# Patient Record
Sex: Female | Born: 1960 | Race: White | Hispanic: No | Smoking: Never smoker
Health system: Southern US, Community
[De-identification: ages and names within clinical notes are randomized; demographics above are authoritative.]

## PROBLEM LIST (undated history)

## (undated) DIAGNOSIS — I2510 Atherosclerotic heart disease of native coronary artery without angina pectoris: Secondary | ICD-10-CM

## (undated) DIAGNOSIS — C801 Malignant (primary) neoplasm, unspecified: Secondary | ICD-10-CM

## (undated) DIAGNOSIS — E039 Hypothyroidism, unspecified: Secondary | ICD-10-CM

## (undated) DIAGNOSIS — I10 Essential (primary) hypertension: Secondary | ICD-10-CM

## (undated) DIAGNOSIS — E669 Obesity, unspecified: Secondary | ICD-10-CM

## (undated) DIAGNOSIS — Z923 Personal history of irradiation: Secondary | ICD-10-CM

## (undated) HISTORY — PX: OTHER SURGICAL HISTORY: SHX169

## (undated) HISTORY — PX: TONSILLECTOMY: SUR1361

## (undated) HISTORY — PX: GASTRIC BYPASS: SHX52

## (undated) HISTORY — PX: CHOLECYSTECTOMY: SHX55

---

## 2013-08-01 DIAGNOSIS — Z923 Personal history of irradiation: Secondary | ICD-10-CM

## 2013-08-01 HISTORY — DX: Personal history of irradiation: Z92.3

## 2015-04-30 ENCOUNTER — Encounter (HOSPITAL_COMMUNITY): Payer: Self-pay

## 2015-04-30 ENCOUNTER — Emergency Department (HOSPITAL_COMMUNITY)
Admission: EM | Admit: 2015-04-30 | Discharge: 2015-05-01 | Disposition: A | Discharge status: Home / Self Care | Payer: Medicare Other | Attending: Emergency Medicine | Admitting: Emergency Medicine

## 2015-04-30 DIAGNOSIS — I10 Essential (primary) hypertension: Secondary | ICD-10-CM | POA: Diagnosis not present

## 2015-04-30 DIAGNOSIS — N760 Acute vaginitis: Secondary | ICD-10-CM | POA: Diagnosis not present

## 2015-04-30 DIAGNOSIS — I2510 Atherosclerotic heart disease of native coronary artery without angina pectoris: Secondary | ICD-10-CM | POA: Insufficient documentation

## 2015-04-30 DIAGNOSIS — N898 Other specified noninflammatory disorders of vagina: Secondary | ICD-10-CM | POA: Diagnosis present

## 2015-04-30 DIAGNOSIS — N390 Urinary tract infection, site not specified: Secondary | ICD-10-CM | POA: Diagnosis not present

## 2015-04-30 DIAGNOSIS — E669 Obesity, unspecified: Secondary | ICD-10-CM | POA: Insufficient documentation

## 2015-04-30 DIAGNOSIS — A599 Trichomoniasis, unspecified: Secondary | ICD-10-CM | POA: Insufficient documentation

## 2015-04-30 DIAGNOSIS — Z9884 Bariatric surgery status: Secondary | ICD-10-CM | POA: Diagnosis not present

## 2015-04-30 DIAGNOSIS — Z79899 Other long term (current) drug therapy: Secondary | ICD-10-CM | POA: Diagnosis not present

## 2015-04-30 DIAGNOSIS — E039 Hypothyroidism, unspecified: Secondary | ICD-10-CM | POA: Diagnosis not present

## 2015-04-30 DIAGNOSIS — B9689 Other specified bacterial agents as the cause of diseases classified elsewhere: Secondary | ICD-10-CM

## 2015-04-30 DIAGNOSIS — Z85841 Personal history of malignant neoplasm of brain: Secondary | ICD-10-CM | POA: Insufficient documentation

## 2015-04-30 HISTORY — DX: Hypothyroidism, unspecified: E03.9

## 2015-04-30 HISTORY — DX: Essential (primary) hypertension: I10

## 2015-04-30 HISTORY — DX: Malignant (primary) neoplasm, unspecified: C80.1

## 2015-04-30 HISTORY — DX: Atherosclerotic heart disease of native coronary artery without angina pectoris: I25.10

## 2015-04-30 HISTORY — DX: Obesity, unspecified: E66.9

## 2015-04-30 LAB — URINALYSIS, ROUTINE W REFLEX MICROSCOPIC
Bilirubin Urine: NEGATIVE
Glucose, UA: NEGATIVE mg/dL
Hgb urine dipstick: NEGATIVE
Ketones, ur: NEGATIVE mg/dL
Nitrite: NEGATIVE
Protein, ur: NEGATIVE mg/dL
Specific Gravity, Urine: 1.02 (ref 1.005–1.030)
Urobilinogen, UA: 1 mg/dL (ref 0.0–1.0)
pH: 7.5 (ref 5.0–8.0)

## 2015-04-30 LAB — WET PREP, GENITAL: Yeast Wet Prep HPF POC: NONE SEEN

## 2015-04-30 LAB — URINE MICROSCOPIC-ADD ON

## 2015-04-30 MED ORDER — AZITHROMYCIN 250 MG PO TABS
1000.0000 mg | ORAL_TABLET | Freq: Once | ORAL | Status: AC
  Administered 2015-04-30: 1000 mg via ORAL
  Filled 2015-04-30: qty 4

## 2015-04-30 MED ORDER — LIDOCAINE HCL 1 % IJ SOLN
INTRAMUSCULAR | Status: AC
  Filled 2015-04-30: qty 20

## 2015-04-30 MED ORDER — CEFTRIAXONE SODIUM 250 MG IJ SOLR
250.0000 mg | INTRAMUSCULAR | Status: DC
  Administered 2015-04-30: 250 mg via INTRAMUSCULAR
  Filled 2015-04-30: qty 250

## 2015-04-30 NOTE — ED Notes (Signed)
Pelvic setup at bedside.

## 2015-04-30 NOTE — ED Provider Notes (Signed)
CSN: [id number]     Arrival date & time 04/30/15  1735 History   First MD Initiated Contact with Patient 04/30/15 2045     Chief Complaint  Patient presents with  . Vaginal Itching   HPI   54 year old female presents today with vaginal itching. Patient reports that she lives in an assisted living facility, recently transferred to [facility]. She reports she has been unable to have her primary care switched over on her Medicaid card. She notes that she has episodes of vaginal itching multiple times year, is prescribed Premarin by her physician that relieves the symptoms. She notes symptoms have been present for 3 weeks, she denies rash, reports that after excessive itchiness and scratching she does have some excoriation and bleeding. Patient does note some burning with urination. Denies frequency, change in color clarity or characteristics. Patient reports she is not diabetic, is otherwise healthy and has no other complaints. She reports she does have itching up under her belly folds in addition. Denies fever, chills, nausea, vomiting, abdominal pain, chest pain, shortness of breath, or any other rashes. Patient reports last sexual activity was in March 2016.  Past Medical History  Diagnosis Date  . Hypothyroidism   . Obesity   . Hypertension   . Coronary artery disease   . Cancer     Brain   Past Surgical History  Procedure Laterality Date  . Tonsillectomy    . Cholecystectomy    . Gastric bypass    . Arm surgery     No family history on file. History  Substance Use Topics  . Smoking status: Never Smoker   . Smokeless tobacco: Not on file  . Alcohol Use: No   OB History    No data available     Review of Systems  All other systems reviewed and are negative.   Allergies  Avapro; Clindamycin/lincomycin; Codeine; Ibuprofen; Prilosec; Vasotec; Vistaril; Zyrtec; and Keflex  Home Medications   Prior to Admission medications   Medication Sig Start Date End Date Taking?  Authorizing Provider  bisacodyl (DULCOLAX) 10 MG suppository Place 10 mg rectally as needed for mild constipation or moderate constipation (*self administered*).   Yes Historical Provider, MD  citalopram (CELEXA) 40 MG tablet Take 40 mg by mouth daily.   Yes Historical Provider, MD  guaifenesin (ROBITUSSIN) 100 MG/5ML syrup Take 200 mg by mouth 4 (four) times daily as needed for cough.   Yes Historical Provider, MD  levothyroxine (SYNTHROID, LEVOTHROID) 50 MCG tablet Take 50 mcg by mouth daily before breakfast.   Yes Historical Provider, MD  lisinopril (PRINIVIL,ZESTRIL) 5 MG tablet Take 5 mg by mouth daily.   Yes Historical Provider, MD  magnesium hydroxide (MILK OF MAGNESIA) 400 MG/5ML suspension Take 15 mLs by mouth daily as needed for mild constipation.   Yes Historical Provider, MD  meclizine (ANTIVERT) 25 MG tablet Take 25 mg by mouth daily.   Yes Historical Provider, MD  metoprolol (LOPRESSOR) 50 MG tablet Take 50 mg by mouth 2 (two) times daily.   Yes Historical Provider, MD  metroNIDAZOLE (FLAGYL) 500 MG tablet Take 1 tablet (500 mg total) by mouth 2 (two) times daily. 05/01/15   Okey Regal, PA-C  Multiple Vitamin (MULTIVITAMIN WITH MINERALS) TABS tablet Take 1 tablet by mouth daily.   Yes Historical Provider, MD  nystatin (MYCOSTATIN/NYSTOP) 100000 UNIT/GM POWD Apply 1-2 g topically daily. For 3 weeks then leave at bedside as needed.   Yes Historical Provider, MD  Olopatadine HCl (PATADAY) 0.2 %  SOLN Apply 1 drop to eye daily.   Yes Historical Provider, MD  potassium chloride SA (K-DUR,KLOR-CON) 20 MEQ tablet Take 20 mEq by mouth daily.   Yes Historical Provider, MD  ranitidine (ZANTAC) 150 MG tablet Take 150 mg by mouth daily.   Yes Historical Provider, MD  solifenacin (VESICARE) 10 MG tablet Take 10 mg by mouth daily.   Yes Historical Provider, MD  sulfamethoxazole-trimethoprim (BACTRIM DS,SEPTRA DS) 800-160 MG per tablet Take 1 tablet by mouth 2 (two) times daily. 05/01/15 05/08/15   Okey Regal, PA-C  traMADol (ULTRAM) 50 MG tablet Take 25 mg by mouth every 12 (twelve) hours as needed for moderate pain.   Yes Historical Provider, MD  triamterene-hydrochlorothiazide (MAXZIDE-25) 37.5-25 MG per tablet Take 1 tablet by mouth daily.   Yes Historical Provider, MD  verapamil (VERELAN PM) 240 MG 24 hr capsule Take 240 mg by mouth daily.   Yes Historical Provider, MD   BP 110/70 mmHg  Pulse 78  Temp(Src) 97.9 F (36.6 C) (Oral)  Resp 20  SpO2 100%   Physical Exam  Constitutional: She is oriented to person, place, and time. She appears well-developed and well-nourished.  HENT:  Head: Normocephalic and atraumatic.  Eyes: Conjunctivae are normal. Pupils are equal, round, and reactive to light. Right eye exhibits no discharge. Left eye exhibits no discharge. No scleral icterus.  Neck: Normal range of motion. No JVD present. No tracheal deviation present.  Cardiovascular: Normal rate.   Pulmonary/Chest: Effort normal. No stridor.  Genitourinary: No erythema, tenderness or bleeding in the vagina. No foreign body around the vagina. No signs of injury around the vagina. Vaginal discharge found.  Intertrigo to bell and groin, minimal.   Neurological: She is alert and oriented to person, place, and time. Coordination normal.  Psychiatric: She has a normal mood and affect. Her behavior is normal. Judgment and thought content normal.  Nursing note and vitals reviewed.    ED Course  Procedures (including critical care time) Labs Review Labs Reviewed  WET PREP, GENITAL - Abnormal; Notable for the following:    Trich, Wet Prep MODERATE (*)    Clue Cells Wet Prep HPF POC FEW (*)    WBC, Wet Prep HPF POC MANY (*)    All other components within normal limits  URINALYSIS, ROUTINE W REFLEX MICROSCOPIC (NOT AT [facility]) - Abnormal; Notable for the following:    Leukocytes, UA LARGE (*)    All other components within normal limits  URINE MICROSCOPIC-ADD ON - Abnormal; Notable for the  following:    Bacteria, UA FEW (*)    All other components within normal limits  RPR  HIV ANTIBODY (ROUTINE TESTING)    Imaging Review No results found.   EKG Interpretation None      MDM   Final diagnoses:  Vaginal itching  UTI (lower urinary tract infection)  BV (bacterial vaginosis)  Trichomoniasis    Labs: Urinalysis, wet prep, HIV, RPR- significant for trichomoniasis, clue cells  Imaging:  Consults:  Therapeutics: Rocephin, azithromycin  Discharge Meds: Metronidazole, Bactrim  Assessment/Plan: Patient presents with vaginal itching and burning with urinations and rash to the groin. Patient has been using nystatin powder which is significantly improved the rash, discharge likely from trichomoniasis BV, prophylactically treated for gonorrhea Chlamydia. Patient discharged home with medications as above for urinary tract infection and STDs. She is encouraged follow-up to primary care provider for reevaluation, strict return precautions given, verbalized understanding agreement and had no further questions or concerns at time discharge.  Okey Regal, PA-C 05/01/15 0100  Malvin Johns, MD 05/01/15 [phone]

## 2015-04-30 NOTE — ED Notes (Signed)
Per EMS, Pt, from [facility], c/o vaginal itching x 3 weeks.  Denies pain and discharge.

## 2015-05-01 LAB — RPR: RPR Ser Ql: NONREACTIVE

## 2015-05-01 LAB — HIV ANTIBODY (ROUTINE TESTING W REFLEX): HIV Screen 4th Generation wRfx: NONREACTIVE

## 2015-05-01 MED ORDER — METRONIDAZOLE 500 MG PO TABS: 500.0000 mg | ORAL_TABLET | Freq: Two times a day (BID) | ORAL | Status: DC

## 2015-05-01 MED ORDER — SULFAMETHOXAZOLE-TRIMETHOPRIM 800-160 MG PO TABS: 1.0000 | ORAL_TABLET | Freq: Two times a day (BID) | ORAL | Status: DC

## 2015-05-01 NOTE — Discharge Instructions (Signed)
Please use medication as directed, please follow-up with her primary care provider for further evaluation and management. Please return to the ED if new or worsening signs or symptoms present.

## 2015-05-01 NOTE — ED Notes (Signed)
[facility] contacted for transport back to [facility]/  Pt c/o feeling nausea.  States this is not new.  She gets nauseous all the time.

## 2015-05-03 ENCOUNTER — Emergency Department (HOSPITAL_COMMUNITY)
Admission: EM | Admit: 2015-05-03 | Discharge: 2015-05-03 | Disposition: A | Discharge status: Home / Self Care | Payer: Medicare Other | Attending: Emergency Medicine | Admitting: Emergency Medicine

## 2015-05-03 ENCOUNTER — Encounter (HOSPITAL_COMMUNITY): Payer: Self-pay | Admitting: Emergency Medicine

## 2015-05-03 DIAGNOSIS — R21 Rash and other nonspecific skin eruption: Secondary | ICD-10-CM | POA: Diagnosis present

## 2015-05-03 DIAGNOSIS — L509 Urticaria, unspecified: Secondary | ICD-10-CM | POA: Diagnosis not present

## 2015-05-03 DIAGNOSIS — E039 Hypothyroidism, unspecified: Secondary | ICD-10-CM | POA: Diagnosis not present

## 2015-05-03 DIAGNOSIS — I2510 Atherosclerotic heart disease of native coronary artery without angina pectoris: Secondary | ICD-10-CM | POA: Diagnosis not present

## 2015-05-03 DIAGNOSIS — E669 Obesity, unspecified: Secondary | ICD-10-CM | POA: Diagnosis not present

## 2015-05-03 DIAGNOSIS — Z79899 Other long term (current) drug therapy: Secondary | ICD-10-CM | POA: Insufficient documentation

## 2015-05-03 DIAGNOSIS — I10 Essential (primary) hypertension: Secondary | ICD-10-CM | POA: Insufficient documentation

## 2015-05-03 DIAGNOSIS — Z85841 Personal history of malignant neoplasm of brain: Secondary | ICD-10-CM | POA: Insufficient documentation

## 2015-05-03 MED ORDER — NITROFURANTOIN MONOHYD MACRO 100 MG PO CAPS: 100.0000 mg | ORAL_CAPSULE | Freq: Two times a day (BID) | ORAL | Status: DC

## 2015-05-03 MED ORDER — HYDROCORTISONE 1 % EX CREA: TOPICAL_CREAM | CUTANEOUS | Status: DC

## 2015-05-03 NOTE — ED Notes (Signed)
EDP at bedside  

## 2015-05-03 NOTE — ED Provider Notes (Signed)
CSN: [id number]   Arrival date & time 05/03/15 0031  History  This chart was scribed for  Gabriella Freiberg, MD by [name], ED Scribe. This patient was seen in room D32C/D32C and the patient's care was started at 12:34 AM.  Chief Complaint  Patient presents with  . Rash    HPI The history is provided by the patient. No language interpreter was used.   Gabriella Roth is a 54 y.o. female who presents to the Emergency Department complaining of pruritic rash to with onset this evening after starting Flagyl and sulfamethoxazole-trimethoprim this morning. The rash is on the neck and arms and she notes that it is mildly painful and significantly pruritic. Pt was seen at [facility] 3 days ago where she was treated with Rocephin IM and azithromycin for a UTI and trichomoniasis. She took nothing for the rash and itching because she has had jitteriness with Benadryl in the past. Pt denies SOB. She lives at [facility] because of fall risks associated with a brain tumor. No one else at the facility has a similar rash that she is aware of. She is unsure if the facility started using a new detergent. Last saw Dr. Vallarie Mare for the brain cancer earlier this year. Treated with radiation therapy in 2014.   Past Medical History  Diagnosis Date  . Hypothyroidism   . Obesity   . Hypertension   . Coronary artery disease   . Cancer     Brain    Past Surgical History  Procedure Laterality Date  . Tonsillectomy    . Cholecystectomy    . Gastric bypass    . Arm surgery      No family history on file.  History  Substance Use Topics  . Smoking status: Never Smoker   . Smokeless tobacco: Not on file  . Alcohol Use: No     Review of Systems  Constitutional: Negative for fever and chills.  HENT: Negative for sore throat and trouble swallowing.   Respiratory: Negative for chest tightness and shortness of breath.   Skin: Positive for rash.  All other systems reviewed and are negative.    Home  Medications   Prior to Admission medications   Medication Sig Start Date End Date Taking? Authorizing Provider  bisacodyl (DULCOLAX) 10 MG suppository Place 10 mg rectally as needed for mild constipation or moderate constipation (*self administered*).    Historical Provider, MD  citalopram (CELEXA) 40 MG tablet Take 40 mg by mouth daily.    Historical Provider, MD  guaifenesin (ROBITUSSIN) 100 MG/5ML syrup Take 200 mg by mouth 4 (four) times daily as needed for cough.    Historical Provider, MD  hydrocortisone cream 1 % Apply to affected area 3 times daily 05/03/15   Gabriella Freiberg, MD  levothyroxine (SYNTHROID, LEVOTHROID) 50 MCG tablet Take 50 mcg by mouth daily before breakfast.    Historical Provider, MD  lisinopril (PRINIVIL,ZESTRIL) 5 MG tablet Take 5 mg by mouth daily.    Historical Provider, MD  magnesium hydroxide (MILK OF MAGNESIA) 400 MG/5ML suspension Take 15 mLs by mouth daily as needed for mild constipation.    Historical Provider, MD  meclizine (ANTIVERT) 25 MG tablet Take 25 mg by mouth daily.    Historical Provider, MD  metoprolol (LOPRESSOR) 50 MG tablet Take 50 mg by mouth 2 (two) times daily.    Historical Provider, MD  metroNIDAZOLE (FLAGYL) 500 MG tablet Take 1 tablet (500 mg total) by mouth 2 (two) times daily. 05/01/15  Okey Regal, PA-C  Multiple Vitamin (MULTIVITAMIN WITH MINERALS) TABS tablet Take 1 tablet by mouth daily.    Historical Provider, MD  nitrofurantoin, macrocrystal-monohydrate, (MACROBID) 100 MG capsule Take 1 capsule (100 mg total) by mouth 2 (two) times daily. X 7 days 05/03/15   Gabriella Freiberg, MD  nystatin (MYCOSTATIN/NYSTOP) 100000 UNIT/GM POWD Apply 1-2 g topically daily. For 3 weeks then leave at bedside as needed.    Historical Provider, MD  Olopatadine HCl (PATADAY) 0.2 % SOLN Apply 1 drop to eye daily.    Historical Provider, MD  potassium chloride SA (K-DUR,KLOR-CON) 20 MEQ tablet Take 20 mEq by mouth daily.    Historical Provider, MD  ranitidine  (ZANTAC) 150 MG tablet Take 150 mg by mouth daily.    Historical Provider, MD  solifenacin (VESICARE) 10 MG tablet Take 10 mg by mouth daily.    Historical Provider, MD  traMADol (ULTRAM) 50 MG tablet Take 25 mg by mouth every 12 (twelve) hours as needed for moderate pain.    Historical Provider, MD  triamterene-hydrochlorothiazide (MAXZIDE-25) 37.5-25 MG per tablet Take 1 tablet by mouth daily.    Historical Provider, MD  verapamil (VERELAN PM) 240 MG 24 hr capsule Take 240 mg by mouth daily.    Historical Provider, MD    Allergies  Avapro; Codeine; Ibuprofen; Prilosec; Vasotec; Vistaril; Zyrtec; Clindamycin/lincomycin; and Keflex  Triage Vitals: BP 117/62 mmHg  Pulse 73  Temp(Src) 97.8 F (36.6 C) (Oral)  Resp 16  SpO2 98%  Physical Exam  Constitutional: She is oriented to person, place, and time. She appears well-developed and well-nourished.  HENT:  Head: Normocephalic and atraumatic.  Right Ear: External ear normal.  Left Ear: External ear normal.  Eyes: Conjunctivae and EOM are normal. Pupils are equal, round, and reactive to light.  Neck: Normal range of motion. Neck supple.  Cardiovascular: Normal rate, regular rhythm, normal heart sounds and intact distal pulses.   Pulmonary/Chest: Effort normal and breath sounds normal.  Abdominal: Soft. Bowel sounds are normal. There is no tenderness.  Musculoskeletal: Normal range of motion.  Neurological: She is alert and oriented to person, place, and time.  Skin: Skin is warm and dry.  Papular/urticarial rash over neck, bil shoulders, L shin  Vitals reviewed.   ED Course  Procedures   DIAGNOSTIC STUDIES: Oxygen Saturation is 98% on RA, normal by my interpretation.    COORDINATION OF CARE: 12:44 AM Discussed treatment plan which includes discharge with Macrobid, hydrocortisone cream, and Allegra with pt at bedside and pt agreed to plan.  Labs Review- Labs Reviewed - No data to display  Imaging Review No results  found.  EKG Interpretation None      MDM   Final diagnoses:  Rash     54 y.o. female with pertinent PMH of schwannoma, recent dx of trich now on bactrim, flagyl presents with rash as above.  Rash primarily pruritic, has the appearance of either very small wheels or a papular aspect. Patient has no systemic symptoms including shortness of breath, GI symptoms. She is unable to take any histamines due to a stated intolerance of jitteriness. She has no rash of her mucous membranes. Discussed symptomatic therapy and she was given prescription for cortisone cream as well as will change from Bactrim to Macrobid with an abundance of caution. She was instructed to follow-up with PCP. Discharged home in stable condition..    I have reviewed all laboratory and imaging studies if ordered as above  1. Rash  Gabriella Freiberg, MD 05/03/15 [phone]

## 2015-05-03 NOTE — ED Notes (Signed)
Pt in [place] from [facility]. Pt went to [facility] thurs dx with UTI and trich. Given rocephin IM as well as azithro. Pt started taking metro and sulfa this am and noticed a rash after taking abx, then noticed bumps on chest at 1030pm. Denies SOB

## 2015-05-03 NOTE — ED Notes (Signed)
[facility] called  

## 2015-05-03 NOTE — Discharge Instructions (Signed)

## 2015-07-09 ENCOUNTER — Emergency Department (HOSPITAL_COMMUNITY)
Admission: EM | Admit: 2015-07-09 | Discharge: 2015-07-09 | Disposition: A | Discharge status: Nursing Home (Includes ICF) | Payer: Medicare Other | Attending: Emergency Medicine | Admitting: Emergency Medicine

## 2015-07-09 ENCOUNTER — Encounter (HOSPITAL_COMMUNITY): Payer: Self-pay | Admitting: Emergency Medicine

## 2015-07-09 DIAGNOSIS — E039 Hypothyroidism, unspecified: Secondary | ICD-10-CM | POA: Insufficient documentation

## 2015-07-09 DIAGNOSIS — Y9389 Activity, other specified: Secondary | ICD-10-CM | POA: Insufficient documentation

## 2015-07-09 DIAGNOSIS — Z23 Encounter for immunization: Secondary | ICD-10-CM | POA: Diagnosis not present

## 2015-07-09 DIAGNOSIS — W5301XA Bitten by mouse, initial encounter: Secondary | ICD-10-CM | POA: Insufficient documentation

## 2015-07-09 DIAGNOSIS — Y998 Other external cause status: Secondary | ICD-10-CM | POA: Diagnosis not present

## 2015-07-09 DIAGNOSIS — Z85841 Personal history of malignant neoplasm of brain: Secondary | ICD-10-CM | POA: Insufficient documentation

## 2015-07-09 DIAGNOSIS — Z79899 Other long term (current) drug therapy: Secondary | ICD-10-CM | POA: Insufficient documentation

## 2015-07-09 DIAGNOSIS — Y92199 Unspecified place in other specified residential institution as the place of occurrence of the external cause: Secondary | ICD-10-CM | POA: Diagnosis not present

## 2015-07-09 DIAGNOSIS — S61254A Open bite of right ring finger without damage to nail, initial encounter: Secondary | ICD-10-CM | POA: Insufficient documentation

## 2015-07-09 DIAGNOSIS — I2510 Atherosclerotic heart disease of native coronary artery without angina pectoris: Secondary | ICD-10-CM | POA: Diagnosis not present

## 2015-07-09 DIAGNOSIS — E669 Obesity, unspecified: Secondary | ICD-10-CM | POA: Diagnosis not present

## 2015-07-09 DIAGNOSIS — I10 Essential (primary) hypertension: Secondary | ICD-10-CM | POA: Diagnosis not present

## 2015-07-09 MED ORDER — AMOXICILLIN-POT CLAVULANATE 875-125 MG PO TABS: 1.0000 | ORAL_TABLET | Freq: Two times a day (BID) | ORAL | Status: DC

## 2015-07-09 MED ORDER — AMOXICILLIN-POT CLAVULANATE 875-125 MG PO TABS
1.0000 | ORAL_TABLET | Freq: Once | ORAL | Status: AC
  Administered 2015-07-09: 1 via ORAL
  Filled 2015-07-09: qty 1

## 2015-07-09 MED ORDER — TETANUS-DIPHTH-ACELL PERTUSSIS 5-2.5-18.5 LF-MCG/0.5 IM SUSP
0.5000 mL | Freq: Once | INTRAMUSCULAR | Status: AC
  Administered 2015-07-09: 0.5 mL via INTRAMUSCULAR
  Filled 2015-07-09: qty 0.5

## 2015-07-09 MED ORDER — BACITRACIN ZINC 500 UNIT/GM EX OINT: 1.0000 "application " | TOPICAL_OINTMENT | Freq: Two times a day (BID) | CUTANEOUS | Status: DC

## 2015-07-09 NOTE — ED Notes (Signed)
Per EMS pt is a resident at [facility] and was bit by a mouse on her right ring finger  The mouse was stuck on mouse sticky paper and she reached down to reposition the mouse so more of the mouse was attached to the paper when it bit her  The pt had no bleeding  Pt placed the mouse in a paper bag prior to EMS arrival  Mouse was not transported to the hospital with pt

## 2015-07-09 NOTE — ED Notes (Signed)
MD at bedside. 

## 2015-07-09 NOTE — ED Provider Notes (Signed)
CSN: [id number]     Arrival date & time 07/09/15  0134 History  By signing my name below, I, Gabriella Roth, attest that this documentation has been prepared under the direction and in the presence of Varney Biles, MD. Electronically Signed: Judithann Sauger, ED Scribe. 07/09/2015. 3:26 AM.    Chief Complaint  Patient presents with  . Animal Bite   The history is provided by the patient. No language interpreter was used.   HPI Comments: Gabriella Roth is a 54 y.o. female who presents to the Emergency Department status post mouse bite on right ring finger that occurred when she caught a mouse PTA. She explains that a mouse was partially on a mouse sticky paper and she tried to get the rest of its body on the pad but the mouse bite her through her gloves. She believes that the mouse stayed in the vicinity of her house instead of going outside and coming back in. Pt is unsure of date of last Tetanus vaccine.  Past Medical History  Diagnosis Date  . Hypothyroidism   . Obesity   . Hypertension   . Coronary artery disease   . Cancer [facility])     Brain   Past Surgical History  Procedure Laterality Date  . Tonsillectomy    . Cholecystectomy    . Gastric bypass    . Arm surgery     History reviewed. No pertinent family history. Social History  Substance Use Topics  . Smoking status: Never Smoker   . Smokeless tobacco: None  . Alcohol Use: No   OB History    No data available     Review of Systems  Constitutional: Negative for fever.  Skin: Positive for wound.      Allergies  Avapro; Codeine; Ibuprofen; Prilosec; Vasotec; Vistaril; Zyrtec; Bactrim; Clindamycin/lincomycin; and Keflex  Home Medications   Prior to Admission medications   Medication Sig Start Date End Date Taking? Authorizing Provider  alum & mag hydroxide-simeth (MAALOX/MYLANTA) 200-200-20 MG/5ML suspension Take 30 mLs by mouth 2 (two) times daily as needed for indigestion or heartburn.   Yes Historical  Provider, MD  bisacodyl (DULCOLAX) 10 MG suppository Place 10 mg rectally as needed for mild constipation or moderate constipation (*self administered*).   Yes Historical Provider, MD  citalopram (CELEXA) 40 MG tablet Take 40 mg by mouth daily.   Yes Historical Provider, MD  guaifenesin (ROBITUSSIN) 100 MG/5ML syrup Take 200 mg by mouth 4 (four) times daily as needed for cough.   Yes Historical Provider, MD  levothyroxine (SYNTHROID, LEVOTHROID) 50 MCG tablet Take 50 mcg by mouth daily before breakfast.   Yes Historical Provider, MD  lisinopril (PRINIVIL,ZESTRIL) 5 MG tablet Take 5 mg by mouth daily.   Yes Historical Provider, MD  magnesium hydroxide (MILK OF MAGNESIA) 400 MG/5ML suspension Take 15 mLs by mouth daily as needed for mild constipation.   Yes Historical Provider, MD  meclizine (ANTIVERT) 25 MG tablet Take 25 mg by mouth daily.   Yes Historical Provider, MD  metoprolol (LOPRESSOR) 50 MG tablet Take 50 mg by mouth 2 (two) times daily.   Yes Historical Provider, MD  Multiple Vitamin (MULTIVITAMIN WITH MINERALS) TABS tablet Take 1 tablet by mouth daily.   Yes Historical Provider, MD  nystatin (MYCOSTATIN/NYSTOP) 100000 UNIT/GM POWD Apply 1-2 g topically daily. For 3 weeks then leave at bedside as needed.   Yes Historical Provider, MD  Olopatadine HCl (PATADAY) 0.2 % SOLN Apply 1 drop to eye daily.   Yes Historical  Provider, MD  potassium chloride SA (K-DUR,KLOR-CON) 20 MEQ tablet Take 20 mEq by mouth daily.   Yes Historical Provider, MD  ranitidine (ZANTAC) 150 MG tablet Take 150 mg by mouth daily.   Yes Historical Provider, MD  solifenacin (VESICARE) 10 MG tablet Take 10 mg by mouth daily.   Yes Historical Provider, MD  traMADol (ULTRAM) 50 MG tablet Take 25 mg by mouth every 12 (twelve) hours as needed for moderate pain.   Yes Historical Provider, MD  triamterene-hydrochlorothiazide (MAXZIDE-25) 37.5-25 MG per tablet Take 1 tablet by mouth daily.   Yes Historical Provider, MD  verapamil  (VERELAN PM) 240 MG 24 hr capsule Take 240 mg by mouth daily.   Yes Historical Provider, MD  amoxicillin-clavulanate (AUGMENTIN) 875-125 MG tablet Take 1 tablet by mouth 2 (two) times daily. 07/09/15   Varney Biles, MD  bacitracin ointment Apply 1 application topically 2 (two) times daily. 07/09/15   Varney Biles, MD  hydrocortisone cream 1 % Apply to affected area 3 times daily Patient not taking: Reported on 07/09/2015 05/03/15   Debby Freiberg, MD  metroNIDAZOLE (FLAGYL) 500 MG tablet Take 1 tablet (500 mg total) by mouth 2 (two) times daily. Patient not taking: Reported on 07/09/2015 05/01/15   Okey Regal, PA-C  nitrofurantoin, macrocrystal-monohydrate, (MACROBID) 100 MG capsule Take 1 capsule (100 mg total) by mouth 2 (two) times daily. X 7 days Patient not taking: Reported on 07/09/2015 05/03/15   Debby Freiberg, MD   BP 117/63 mmHg  Pulse 74  Temp(Src) 97.8 F (36.6 C) (Oral)  Resp 18  Ht 5\' 5"  (1.651 m)  Wt 255 lb (115.667 kg)  BMI 42.43 kg/m2  SpO2 100% Physical Exam  Constitutional: She is oriented to person, place, and time. She appears well-developed and well-nourished.  HENT:  Head: Normocephalic and atraumatic.  Pulmonary/Chest: Effort normal.  Musculoskeletal:  There is no edema surrounding the lesion. Pt able to flex and extend proximal to the bite wound. Sensory exam is normal.   Neurological: She is alert and oriented to person, place, and time.  Skin: Skin is warm and dry.  Small bite insertion wound to the right hand, ring finger. The wound is distal to the DIP on the palmar aspect of the hand.  Psychiatric: She has a normal mood and affect.  Nursing note and vitals reviewed.   ED Course  Procedures (including critical care time) DIAGNOSTIC STUDIES: Oxygen Saturation is 100% on RA, normal by my interpretation.    COORDINATION OF CARE: 3:15 AM- Pt advised of plan for treatment and pt agrees.    Labs Review Labs Reviewed - No data to display  Imaging  Review No results found. Varney Biles, MD has personally reviewed and evaluated these images and lab results as part of his medical decision-making.   EKG Interpretation None      MDM   Final diagnoses:  Bitten by mouse, initial encounter    I personally performed the services described in this documentation, which was scribed in my presence. The recorded information has been reviewed and is accurate.  Pt comes in with cc of bite by a mouse. The mouse has been roaming around her room the last few days, she caught it and was trying to grab it. Unlikely to have rabies infection from it. We will cover with augmentin, tetanus shot. She will return to the ER if the swelling, pain get worse.    Varney Biles, MD 07/09/15 [phone]

## 2015-07-09 NOTE — ED Notes (Signed)
[facility] called for transport back to facility 

## 2015-07-09 NOTE — Discharge Instructions (Signed)
Take the antibiotics prescribed. Keep the area clean and dry.  Return to the ER if there SPREAD in redness, swelling and significant pain.  Animal Bite Animal bites can range from mild to serious. An animal bite can result in a scratch on the skin, a deep open cut, a puncture of the skin, a crush injury, or tearing away of the skin or a body part. A small bite from a house pet will usually not cause serious problems. However, some animal bites can become infected or injure a bone or other tissue.  Bites from certain animals can be more dangerous because of the risk of spreading rabies, which is a serious viral infection. This risk is higher with bites from stray animals or wild animals, such as raccoons, foxes, skunks, and bats. Dogs are responsible for most animal bites. Children are bitten more often than adults. SYMPTOMS  Common symptoms of an animal bite include:   Pain.   Bleeding.   Swelling.   Bruising.  DIAGNOSIS  This condition may be diagnosed based on a physical exam and medical history. Your health care provider will examine the wound and ask for details about the animal and how the bite happened. You may also have tests, such as:   Blood tests to check for infection or to determine if surgery is needed.  X-rays to check for damage to bones or joints.  Culture test. This uses a sample of fluid from the wound to check for infection. TREATMENT  Treatment varies depending on the location and type of animal bite and your medical history. Treatment may include:   Wound care. This often includes cleaning the wound, flushing the wound with saline solution, and applying a bandage (dressing). Sometimes, the wound is left open to heal because of the high risk of infection. However, in some cases, the wound may be closed with stitches (sutures), staples, skin glue, or adhesive strips.   Gabriella Roth medicine.   Tetanus shot.   Rabies treatment if the animal could have rabies.   In some cases, bites that have become infected may require IV antibiotics and surgical treatment in the hospital.  [place]  Follow instructions from your health care provider about how to take care of your wound. Make sure you:  Wash your hands with soap and water before you change your dressing. If soap and water are not available, use hand sanitizer.  Change your dressing as told by your health care provider.  Leave sutures, skin glue, or adhesive strips in place. These skin closures may need to be in place for 2 weeks or longer. If adhesive strip edges start to loosen and curl up, you may trim the loose edges. Do not remove adhesive strips completely unless your health care provider tells you to do that.  Check your wound every day for signs of infection. Watch for:   Increasing redness, swelling, or pain.   Fluid, blood, or pus.  General Instructions  Take or apply over-the-counter and prescription medicines only as told by your health care provider.   If you were prescribed an Gabriella Roth, take or apply it as told by your health care provider. Do not stop using the Gabriella Roth even if your condition improves.   Keep the injured area raised (elevated) above the level of your heart while you are sitting or lying down, if this is possible.   If directed, apply ice to the injured area.   Put ice in a plastic bag.  Place a towel between your skin and the bag.   Leave the ice on for 20 minutes, 2-3 times per day.   Keep all follow-up visits as told by your health care provider. This is important.  SEEK MEDICAL CARE IF:  You have increasing redness, swelling, or pain at the site of your wound.   You have a general feeling of sickness (malaise).   You feel nauseous or you vomit.   You have pain that does not get better.  SEEK IMMEDIATE MEDICAL CARE IF:  You have a red streak extending away from your wound.   You have  fluid, blood, or pus coming from your wound.   You have a fever or chills.   You have trouble moving your injured area.   You have numbness or tingling extending beyond the wound.   This information is not intended to replace advice given to you by your health care provider. Make sure you discuss any questions you have with your health care provider.   Document Released: 06/07/2011 Document Revised: 06/10/2015 Document Reviewed: 02/04/2015 Elsevier Interactive Patient Education [employer].

## 2015-09-27 ENCOUNTER — Encounter (HOSPITAL_COMMUNITY): Payer: Self-pay | Admitting: *Deleted

## 2015-09-27 ENCOUNTER — Emergency Department (HOSPITAL_COMMUNITY): Payer: Medicare Other

## 2015-09-27 ENCOUNTER — Emergency Department (HOSPITAL_COMMUNITY)
Admission: EM | Admit: 2015-09-27 | Discharge: 2015-09-28 | Disposition: A | Discharge status: Home / Self Care | Payer: Medicare Other | Attending: Emergency Medicine | Admitting: Emergency Medicine

## 2015-09-27 DIAGNOSIS — I2510 Atherosclerotic heart disease of native coronary artery without angina pectoris: Secondary | ICD-10-CM | POA: Insufficient documentation

## 2015-09-27 DIAGNOSIS — E039 Hypothyroidism, unspecified: Secondary | ICD-10-CM | POA: Diagnosis not present

## 2015-09-27 DIAGNOSIS — Z792 Long term (current) use of antibiotics: Secondary | ICD-10-CM | POA: Diagnosis not present

## 2015-09-27 DIAGNOSIS — Z79899 Other long term (current) drug therapy: Secondary | ICD-10-CM | POA: Insufficient documentation

## 2015-09-27 DIAGNOSIS — Z9884 Bariatric surgery status: Secondary | ICD-10-CM | POA: Diagnosis not present

## 2015-09-27 DIAGNOSIS — J069 Acute upper respiratory infection, unspecified: Secondary | ICD-10-CM

## 2015-09-27 DIAGNOSIS — R05 Cough: Secondary | ICD-10-CM | POA: Diagnosis present

## 2015-09-27 DIAGNOSIS — B9789 Other viral agents as the cause of diseases classified elsewhere: Secondary | ICD-10-CM

## 2015-09-27 DIAGNOSIS — Z85841 Personal history of malignant neoplasm of brain: Secondary | ICD-10-CM | POA: Diagnosis not present

## 2015-09-27 DIAGNOSIS — I10 Essential (primary) hypertension: Secondary | ICD-10-CM | POA: Diagnosis not present

## 2015-09-27 MED ORDER — DM-GUAIFENESIN ER 30-600 MG PO TB12
1.0000 | ORAL_TABLET | Freq: Once | ORAL | Status: AC
  Administered 2015-09-27: 1 via ORAL
  Filled 2015-09-27: qty 1

## 2015-09-27 MED ORDER — DEXAMETHASONE SODIUM PHOSPHATE 10 MG/ML IJ SOLN
10.0000 mg | Freq: Once | INTRAMUSCULAR | Status: AC
  Administered 2015-09-27: 10 mg via INTRAMUSCULAR
  Filled 2015-09-27: qty 1

## 2015-09-27 NOTE — ED Notes (Signed)
Patient presents via EMS with c/o sore throat the has ben going on for a couple of days.  Also has a cough

## 2015-09-27 NOTE — ED Provider Notes (Signed)
CSN: [id number]   Arrival date & time 09/27/15 2222  History  By signing my name below, I, Gabriella Roth, attest that this documentation has been prepared under the direction and in the presence of Gabriella Balls, MD. Electronically Signed: Altamease Roth, ED Scribe. 09/27/2015. 11:13 PM.  Chief Complaint  Patient presents with  . Sore Throat  . Cough    HPI The history is provided by the patient. No language interpreter was used.   Gabriella Roth is a 54 y.o. female who presents to the Emergency Department complaining of a new, 8/10 in severity,  sore throat with onset approximately 2 days ago. Associated symptoms include subjective fever, productive cough, and congestion. Pt denies runny nose and chest pain. She states that she lives in an assisted living facility and several people there are sick with similar symptoms. She denies history of asthma or COPD.   Past Medical History  Diagnosis Date  . Hypothyroidism   . Obesity   . Hypertension   . Coronary artery disease   . 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 Tonsillectomy    . Cholecystectomy    . Gastric bypass    . Arm surgery      No family history on file.  Social History  Substance Use Topics  . Smoking status: Never Smoker   . Smokeless tobacco: Never Used  . Alcohol Use: No     Review of Systems 10 Systems reviewed and all are negative for acute change except as noted in the HPI. Home Medications   Prior to Admission medications   Medication Sig Start Date End Date Taking? Authorizing Provider  alum & mag hydroxide-simeth (MAALOX/MYLANTA) 200-200-20 MG/5ML suspension Take 30 mLs by mouth 2 (two) times daily as needed for indigestion or heartburn.    Historical Provider, MD  amoxicillin-clavulanate (AUGMENTIN) 875-125 MG tablet Take 1 tablet by mouth 2 (two) times daily. 07/09/15   Varney Biles, MD  bacitracin ointment Apply 1 application topically 2 (two) times  daily. 07/09/15   Varney Biles, MD  bisacodyl (DULCOLAX) 10 MG suppository Place 10 mg rectally as needed for mild constipation or moderate constipation (*self administered*).    Historical Provider, MD  citalopram (CELEXA) 40 MG tablet Take 40 mg by mouth daily.    Historical Provider, MD  guaifenesin (ROBITUSSIN) 100 MG/5ML syrup Take 200 mg by mouth 4 (four) times daily as needed for cough.    Historical Provider, MD  hydrocortisone cream 1 % Apply to affected area 3 times daily Patient not taking: Reported on 07/09/2015 05/03/15   Debby Freiberg, MD  levothyroxine (SYNTHROID, LEVOTHROID) 50 MCG tablet Take 50 mcg by mouth daily before breakfast.    Historical Provider, MD  lisinopril (PRINIVIL,ZESTRIL) 5 MG tablet Take 5 mg by mouth daily.    Historical Provider, MD  magnesium hydroxide (MILK OF MAGNESIA) 400 MG/5ML suspension Take 15 mLs by mouth daily as needed for mild constipation.    Historical Provider, MD  meclizine (ANTIVERT) 25 MG tablet Take 25 mg by mouth daily.    Historical Provider, MD  metoprolol (LOPRESSOR) 50 MG tablet Take 50 mg by mouth 2 (two) times daily.    Historical Provider, MD  metroNIDAZOLE (FLAGYL) 500 MG tablet Take 1 tablet (500 mg total) by mouth 2 (two) times daily. Patient not taking: Reported on 07/09/2015 05/01/15   Okey Regal, PA-C  Multiple Vitamin (MULTIVITAMIN WITH MINERALS) TABS tablet Take 1 tablet  by mouth daily.    Historical Provider, MD  nitrofurantoin, macrocrystal-monohydrate, (MACROBID) 100 MG capsule Take 1 capsule (100 mg total) by mouth 2 (two) times daily. X 7 days Patient not taking: Reported on 07/09/2015 05/03/15   Debby Freiberg, MD  nystatin (MYCOSTATIN/NYSTOP) 100000 UNIT/GM POWD Apply 1-2 g topically daily. For 3 weeks then leave at bedside as needed.    Historical Provider, MD  Olopatadine HCl (PATADAY) 0.2 % SOLN Apply 1 drop to eye daily.    Historical Provider, MD  potassium chloride SA (K-DUR,KLOR-CON) 20 MEQ tablet Take 20 mEq by  mouth daily.    Historical Provider, MD  ranitidine (ZANTAC) 150 MG tablet Take 150 mg by mouth daily.    Historical Provider, MD  solifenacin (VESICARE) 10 MG tablet Take 10 mg by mouth daily.    Historical Provider, MD  traMADol (ULTRAM) 50 MG tablet Take 25 mg by mouth every 12 (twelve) hours as needed for moderate pain.    Historical Provider, MD  triamterene-hydrochlorothiazide (MAXZIDE-25) 37.5-25 MG per tablet Take 1 tablet by mouth daily.    Historical Provider, MD  verapamil (VERELAN PM) 240 MG 24 hr capsule Take 240 mg by mouth daily.    Historical Provider, MD    Allergies  Avapro; Codeine; Ibuprofen; Prilosec; Vasotec; Vistaril; Zyrtec; Bactrim; Clindamycin/lincomycin; and Keflex  Triage Vitals: BP 102/72 mmHg  Pulse 74  Temp(Src) 97.9 F (36.6 C) (Oral)  Resp 18  Ht 5\' 5"  (1.651 m)  Wt 255 lb (115.667 kg)  BMI 42.43 kg/m2  SpO2 95%  Physical Exam  Constitutional: She is oriented to person, place, and time. She appears well-developed and well-nourished. No distress.  HENT:  Head: Normocephalic and atraumatic.  Nose: Nose normal.  Mouth/Throat: Oropharynx is clear and moist. No oropharyngeal exudate.  Eyes: Conjunctivae and EOM are normal. Pupils are equal, round, and reactive to light. No scleral icterus.  Neck: Normal range of motion. Neck supple. No JVD present. No tracheal deviation present. No thyromegaly present.  Cardiovascular: Normal rate, regular rhythm and normal heart sounds.  Exam reveals no gallop and no friction rub.   No murmur heard. Pulmonary/Chest: Effort normal and breath sounds normal. No respiratory distress. She has no wheezes. She exhibits no tenderness.  Abdominal: Soft. Bowel sounds are normal. She exhibits no distension and no mass. There is no tenderness. There is no rebound and no guarding.  Musculoskeletal: Normal range of motion. She exhibits no edema or tenderness.  Lymphadenopathy:    She has no cervical adenopathy.  Neurological: She is  alert and oriented to person, place, and time. No cranial nerve deficit. She exhibits normal muscle tone.  Skin: Skin is warm and dry. No rash noted. No erythema. No pallor.  Nursing note and vitals reviewed.   ED Course  Procedures   DIAGNOSTIC STUDIES: Oxygen Saturation is 95% on RA, normal by my interpretation.    COORDINATION OF CARE: 11:13 PM Discussed treatment plan which includes CXR and Decadron with pt at bedside and pt agreed to plan.  Labs Reviewed - No data to display  Imaging Review Dg Chest 2 View  09/27/2015  CLINICAL DATA:  Acute onset of productive cough.  Initial encounter. EXAM: CHEST  2 VIEW COMPARISON:  None. FINDINGS: The lungs are well-aerated. Minimal left basilar atelectasis is noted. There is no evidence of pleural effusion or pneumothorax. The heart is normal in size; the mediastinal contour is within normal limits. No acute osseous abnormalities are seen. Postoperative change is noted about the  upper abdomen. IMPRESSION: Minimal left basilar atelectasis noted.  Lungs otherwise clear. Electronically Signed   By: Garald Balding M.D.   On: 09/27/2015 23:49    I personally reviewed and evaluated these images as a part of my medical decision-making.    MDM   Final diagnoses:  None    Patient presents to the ED for viral URI symptoms with a cough.  Will obtain a cxr for evaluation of pneumonia.  She was given decadron for sore throat.    CXR negative for pneumonia.  Patient appears well and in NAD.  VS remain within her normal limits and she is safe for DC  I personally performed the services described in this documentation, which was scribed in my presence. The recorded information has been reviewed and is accurate.     Gabriella Balls, MD 09/28/15 [phone]

## 2015-09-27 NOTE — Discharge Instructions (Signed)
Upper Respiratory Infection, Adult Gabriella Roth, your chest xray does not show any pneumonia.  See your primary care doctor within 3 days for close follow up.  If symptoms worsen, come back to the ED immediately.  Thank you. Most upper respiratory infections (URIs) are caused by a virus. A URI affects the nose, throat, and upper air passages. The most common type of URI is often called "the common cold." HOME CARE   Take medicines only as told by your doctor.  Gargle warm saltwater or take cough drops to comfort your throat as told by your doctor.  Use a warm mist humidifier or inhale steam from a shower to increase air moisture. This may make it easier to breathe.  Drink enough fluid to keep your pee (urine) clear or pale yellow.  Eat soups and other clear broths.  Have a healthy diet.  Rest as needed.  Go back to work when your fever is gone or your doctor says it is okay.  You may need to stay home longer to avoid giving your URI to others.  You can also wear a face mask and wash your hands often to prevent spread of the virus.  Use your inhaler more if you have asthma.  Do not use any tobacco products, including cigarettes, chewing tobacco, or electronic cigarettes. If you need help quitting, ask your doctor. GET HELP IF:  You are getting worse, not better.  Your symptoms are not helped by medicine.  You have chills.  You are getting more short of breath.  You have brown or red mucus.  You have yellow or brown discharge from your nose.  You have pain in your face, especially when you bend forward.  You have a fever.  You have puffy (swollen) neck glands.  You have pain while swallowing.  You have white areas in the back of your throat. GET HELP RIGHT AWAY IF:   You have very bad or constant:  Headache.  Ear pain.  Pain in your forehead, behind your eyes, and over your cheekbones (sinus pain).  Chest pain.  You have long-lasting (chronic) lung disease  and any of the following:  Wheezing.  Long-lasting cough.  Coughing up blood.  A change in your usual mucus.  You have a stiff neck.  You have changes in your:  Vision.  Hearing.  Thinking.  Mood. MAKE SURE YOU:   Understand these instructions.  Will watch your condition.  Will get help right away if you are not doing well or get worse.   This information is not intended to replace advice given to you by your health care provider. Make sure you discuss any questions you have with your health care provider.   Document Released: 03/07/2008 Document Revised: 02/03/2015 Document Reviewed: 12/25/2013 Elsevier Interactive Patient Education [employer].

## 2015-09-28 MED ORDER — BENZONATATE 100 MG PO CAPS: 100.0000 mg | ORAL_CAPSULE | Freq: Three times a day (TID) | ORAL | Status: DC | PRN

## 2015-09-28 NOTE — ED Notes (Signed)
Discharge instructions given

## 2015-11-26 ENCOUNTER — Other Ambulatory Visit (HOSPITAL_COMMUNITY): Payer: Self-pay | Admitting: Radiology

## 2015-11-26 DIAGNOSIS — G9389 Other specified disorders of brain: Secondary | ICD-10-CM

## 2015-11-26 DIAGNOSIS — D333 Benign neoplasm of cranial nerves: Secondary | ICD-10-CM

## 2015-12-16 ENCOUNTER — Ambulatory Visit (HOSPITAL_COMMUNITY)
Admission: RE | Admit: 2015-12-16 | Discharge: 2015-12-16 | Disposition: A | Discharge status: Home / Self Care | Payer: Medicare Other | Source: Ambulatory Visit | Attending: Radiology | Admitting: Radiology

## 2015-12-16 DIAGNOSIS — G319 Degenerative disease of nervous system, unspecified: Secondary | ICD-10-CM | POA: Insufficient documentation

## 2015-12-16 DIAGNOSIS — G9389 Other specified disorders of brain: Secondary | ICD-10-CM

## 2015-12-16 DIAGNOSIS — K148 Other diseases of tongue: Secondary | ICD-10-CM | POA: Insufficient documentation

## 2015-12-16 DIAGNOSIS — D333 Benign neoplasm of cranial nerves: Secondary | ICD-10-CM | POA: Diagnosis present

## 2015-12-16 LAB — CREATININE, SERUM
CREATININE: 0.83 mg/dL (ref 0.44–1.00)
GFR calc Af Amer: >60 mL/min (ref >60)

## 2015-12-16 MED ORDER — GADOBENATE DIMEGLUMINE 529 MG/ML IV SOLN
20.0000 mL | Freq: Once | INTRAVENOUS | Status: AC
  Administered 2015-12-16: 20 mL via INTRAVENOUS

## 2016-10-03 ENCOUNTER — Emergency Department (HOSPITAL_COMMUNITY)
Admission: EM | Admit: 2016-10-03 | Discharge: 2016-10-03 | Disposition: A | Discharge status: Home / Self Care | Payer: Medicare Other | Attending: Emergency Medicine | Admitting: Emergency Medicine

## 2016-10-03 ENCOUNTER — Encounter (HOSPITAL_COMMUNITY): Payer: Self-pay | Admitting: *Deleted

## 2016-10-03 ENCOUNTER — Emergency Department (HOSPITAL_COMMUNITY): Payer: Medicare Other

## 2016-10-03 DIAGNOSIS — E039 Hypothyroidism, unspecified: Secondary | ICD-10-CM | POA: Diagnosis not present

## 2016-10-03 DIAGNOSIS — R21 Rash and other nonspecific skin eruption: Secondary | ICD-10-CM | POA: Diagnosis not present

## 2016-10-03 DIAGNOSIS — Z85841 Personal history of malignant neoplasm of brain: Secondary | ICD-10-CM | POA: Insufficient documentation

## 2016-10-03 DIAGNOSIS — J40 Bronchitis, not specified as acute or chronic: Secondary | ICD-10-CM | POA: Diagnosis not present

## 2016-10-03 DIAGNOSIS — I10 Essential (primary) hypertension: Secondary | ICD-10-CM | POA: Diagnosis not present

## 2016-10-03 DIAGNOSIS — R531 Weakness: Secondary | ICD-10-CM | POA: Diagnosis present

## 2016-10-03 DIAGNOSIS — I2510 Atherosclerotic heart disease of native coronary artery without angina pectoris: Secondary | ICD-10-CM | POA: Insufficient documentation

## 2016-10-03 LAB — CBG MONITORING, ED: Glucose-Capillary: 86 mg/dL (ref 65–99)

## 2016-10-03 MED ORDER — DOXYCYCLINE HYCLATE 100 MG PO CAPS: 100.0000 mg | ORAL_CAPSULE | Freq: Two times a day (BID) | ORAL | 0 refills | Status: DC

## 2016-10-03 MED ORDER — CLOTRIMAZOLE 1 % EX CREA: TOPICAL_CREAM | CUTANEOUS | 0 refills | Status: DC

## 2016-10-03 MED ORDER — FLUCONAZOLE 100 MG PO TABS
100.0000 mg | ORAL_TABLET | Freq: Once | ORAL | Status: AC
  Administered 2016-10-03: 100 mg via ORAL
  Filled 2016-10-03: qty 1

## 2016-10-03 NOTE — ED Notes (Signed)
Report given over the phone to [facility].

## 2016-10-03 NOTE — ED Triage Notes (Signed)
Pt arrived to er via [facility] with c/o generalized weakness, cough, sore throat, headache, vaginal itching that started a few days ago,

## 2016-10-03 NOTE — ED Provider Notes (Signed)
[facility] DEPT Provider Note   CSN: [id number] Arrival date & time: 10/03/16  1930     History   Chief Complaint Chief Complaint  Patient presents with  . Weakness    HPI [place] Choyce is a 56 y.o. female.  Patient complains of cough congestion and a rash to her left inner thigh    Cough  This is a new problem. The current episode started more than 2 days ago. The problem occurs constantly. The problem has not changed since onset.The cough is non-productive. There has been no fever. Pertinent negatives include no chest pain and no headaches. She has tried nothing for the symptoms. She is not a smoker.    Past Medical History:  Diagnosis Date  . Cancer ([place])    Brain  . Coronary artery disease   . Hypertension   . Hypothyroidism   . Obesity     There are no active problems to display for this patient.   Past Surgical History:  Procedure Laterality Date  . Arm Surgery    . CHOLECYSTECTOMY    . GASTRIC BYPASS    . TONSILLECTOMY      OB History    No data available       Home Medications    Prior to Admission medications   Medication Sig Start Date End Date Taking? Authorizing Provider  alum & mag hydroxide-simeth (MAALOX/MYLANTA) 200-200-20 MG/5ML suspension Take 30 mLs by mouth 2 (two) times daily as needed for indigestion or heartburn.    Historical Provider, MD  amoxicillin-clavulanate (AUGMENTIN) 875-125 MG tablet Take 1 tablet by mouth 2 (two) times daily. 07/09/15   Varney Biles, MD  bacitracin ointment Apply 1 application topically 2 (two) times daily. 07/09/15   Varney Biles, MD  benzonatate (TESSALON) 100 MG capsule Take 1 capsule (100 mg total) by mouth 3 (three) times daily as needed for cough. 09/28/15   Everlene Balls, MD  bisacodyl (DULCOLAX) 10 MG suppository Place 10 mg rectally as needed for mild constipation or moderate constipation (*self administered*).    Historical Provider, MD  citalopram (CELEXA) 40 MG tablet Take 40 mg by mouth  daily.    Historical Provider, MD  clotrimazole (LOTRIMIN) 1 % cream Apply to affected area 2 times daily.   Only use for one week.  Do not use inside vagina 10/03/16   Milton Ferguson, MD  doxycycline (VIBRAMYCIN) 100 MG capsule Take 1 capsule (100 mg total) by mouth 2 (two) times daily. One po bid x 7 days 10/03/16   Milton Ferguson, MD  guaifenesin (ROBITUSSIN) 100 MG/5ML syrup Take 200 mg by mouth 4 (four) times daily as needed for cough.    Historical Provider, MD  levothyroxine (SYNTHROID, LEVOTHROID) 50 MCG tablet Take 50 mcg by mouth daily before breakfast.    Historical Provider, MD  lisinopril (PRINIVIL,ZESTRIL) 5 MG tablet Take 5 mg by mouth daily.    Historical Provider, MD  magnesium hydroxide (MILK OF MAGNESIA) 400 MG/5ML suspension Take 15 mLs by mouth daily as needed for mild constipation.    Historical Provider, MD  meclizine (ANTIVERT) 25 MG tablet Take 25 mg by mouth daily.    Historical Provider, MD  metoprolol (LOPRESSOR) 50 MG tablet Take 50 mg by mouth 2 (two) times daily.    Historical Provider, MD  Multiple Vitamin (MULTIVITAMIN WITH MINERALS) TABS tablet Take 1 tablet by mouth daily.    Historical Provider, MD  nystatin (MYCOSTATIN/NYSTOP) 100000 UNIT/GM POWD Apply 1-2 g topically daily. For 3 weeks then  leave at bedside as needed.    Historical Provider, MD  Olopatadine HCl (PATADAY) 0.2 % SOLN Apply 1 drop to eye daily.    Historical Provider, MD  potassium chloride SA (K-DUR,KLOR-CON) 20 MEQ tablet Take 20 mEq by mouth daily.    Historical Provider, MD  ranitidine (ZANTAC) 150 MG tablet Take 150 mg by mouth daily.    Historical Provider, MD  solifenacin (VESICARE) 10 MG tablet Take 10 mg by mouth daily.    Historical Provider, MD  traMADol (ULTRAM) 50 MG tablet Take 25 mg by mouth every 12 (twelve) hours as needed for moderate pain.    Historical Provider, MD  triamterene-hydrochlorothiazide (MAXZIDE-25) 37.5-25 MG per tablet Take 1 tablet by mouth daily.    Historical Provider,  MD  verapamil (VERELAN PM) 240 MG 24 hr capsule Take 240 mg by mouth daily.    Historical Provider, MD    Family History No family history on file.  Social History Social History  Substance Use Topics  . Smoking status: Never Smoker  . Smokeless tobacco: Never Used  . Alcohol use No     Allergies   Avapro [irbesartan]; Codeine; Ibuprofen; Prilosec [omeprazole]; Vasotec [enalapril]; Vistaril [hydroxyzine hcl]; Zyrtec [cetirizine]; Bactrim [sulfamethoxazole-trimethoprim]; Clindamycin/lincomycin; and Keflex [cephalexin]   Review of Systems Review of Systems  Constitutional: Negative for appetite change and fatigue.  HENT: Negative for congestion, ear discharge and sinus pressure.   Eyes: Negative for discharge.  Respiratory: Positive for cough.   Cardiovascular: Negative for chest pain.  Gastrointestinal: Negative for abdominal pain and diarrhea.  Genitourinary: Negative for frequency and hematuria.  Musculoskeletal: Negative for back pain.  Skin: Positive for rash.  Neurological: Negative for seizures and headaches.  Psychiatric/Behavioral: Negative for hallucinations.     Physical Exam Updated Vital Signs BP 117/86 (BP Location: Left Arm)   Pulse 85   Temp 99.4 F (37.4 C) (Temporal)   Resp 20   Ht 5\' 5"  (1.651 m)   Wt (!) 305 lb (138.3 kg)   SpO2 94%   BMI 50.75 kg/m   Physical Exam  Constitutional: She is oriented to person, place, and time. She appears well-developed.  HENT:  Head: Normocephalic.  Eyes: Conjunctivae and EOM are normal. No scleral icterus.  Neck: Neck supple. No thyromegaly present.  Cardiovascular: Normal rate and regular rhythm.  Exam reveals no gallop and no friction rub.   No murmur heard. Pulmonary/Chest: No stridor. She has no wheezes. She has no rales. She exhibits no tenderness.  Abdominal: She exhibits no distension. There is no tenderness. There is no rebound.  Genitourinary: No vaginal discharge found.  Musculoskeletal: Normal  range of motion. She exhibits no edema.  Lymphadenopathy:    She has no cervical adenopathy.  Neurological: She is oriented to person, place, and time. She exhibits normal muscle tone. Coordination normal.  Skin: Rash noted. No erythema.  Large pruritic rash to the left thigh left lower abdomen. Suspect fungal rash.  Psychiatric: She has a normal mood and affect. Her behavior is normal.     ED Treatments / Results  Labs (all labs ordered are listed, but only abnormal results are displayed) Labs Reviewed  CBG MONITORING, ED    EKG  EKG Interpretation None       Radiology Dg Chest 2 View  Result Date: 10/03/2016 CLINICAL DATA:  Shortness of breath, cough, chest tightness for 3 days. EXAM: CHEST  2 VIEW COMPARISON:  September 27, 2015 FINDINGS: The heart size and mediastinal contours are within normal  limits. There is no focal infiltrate, pulmonary edema, or pleural effusion. There is mild left basilar atelectasis. The visualized skeletal structures are unremarkable. Surgical clips of epigastric region is identified. IMPRESSION: Mild atelectasis of left lung base.  No focal pneumonia. Electronically Signed   By: Abelardo Diesel M.D.   On: 10/03/2016 21:05    Procedures Procedures (including critical care time)  Medications Ordered in ED Medications  fluconazole (DIFLUCAN) tablet 100 mg (not administered)     Initial Impression / Assessment and Plan / ED Course  I have reviewed the triage vital signs and the nursing notes.  Pertinent labs & imaging results that were available during my care of the patient were reviewed by me and considered in my medical decision making (see chart for details).  Clinical Course     Patient with bronchitis and rash. She will be put on some Lotrisone and doxycycline  Final Clinical Impressions(s) / ED Diagnoses   Final diagnoses:  Bronchitis    New Prescriptions New Prescriptions   CLOTRIMAZOLE (LOTRIMIN) 1 % CREAM    Apply to affected  area 2 times daily.   Only use for one week.  Do not use inside vagina   DOXYCYCLINE (VIBRAMYCIN) 100 MG CAPSULE    Take 1 capsule (100 mg total) by mouth 2 (two) times daily. One po bid x 7 days     Milton Ferguson, MD 10/03/16 2121

## 2016-10-03 NOTE — Discharge Instructions (Signed)
Follow-up with her doctor next week for recheck °

## 2016-10-03 NOTE — ED Notes (Signed)
ED Provider at bedside. Assisted MD with pelvic exam.

## 2016-10-11 ENCOUNTER — Other Ambulatory Visit (HOSPITAL_COMMUNITY): Payer: Self-pay | Admitting: Internal Medicine

## 2016-10-11 DIAGNOSIS — Z1231 Encounter for screening mammogram for malignant neoplasm of breast: Secondary | ICD-10-CM

## 2016-10-20 ENCOUNTER — Encounter (HOSPITAL_COMMUNITY): Payer: Self-pay

## 2016-10-20 ENCOUNTER — Ambulatory Visit (HOSPITAL_COMMUNITY): Payer: Medicare Other

## 2016-11-08 ENCOUNTER — Ambulatory Visit: Payer: Medicare Other | Admitting: Urology

## 2016-11-09 ENCOUNTER — Ambulatory Visit (HOSPITAL_COMMUNITY): Payer: Medicare Other

## 2016-11-10 ENCOUNTER — Other Ambulatory Visit (HOSPITAL_COMMUNITY): Payer: Self-pay | Admitting: Radiology

## 2016-11-10 DIAGNOSIS — D3610 Benign neoplasm of peripheral nerves and autonomic nervous system, unspecified: Secondary | ICD-10-CM

## 2016-11-24 ENCOUNTER — Ambulatory Visit (HOSPITAL_COMMUNITY)
Admission: RE | Admit: 2016-11-24 | Discharge: 2016-11-24 | Disposition: A | Discharge status: Home / Self Care | Payer: Medicare Other | Source: Ambulatory Visit | Attending: Internal Medicine | Admitting: Internal Medicine

## 2016-11-24 DIAGNOSIS — Z1231 Encounter for screening mammogram for malignant neoplasm of breast: Secondary | ICD-10-CM | POA: Insufficient documentation

## 2016-12-21 ENCOUNTER — Ambulatory Visit (HOSPITAL_COMMUNITY): Payer: Medicare Other

## 2016-12-22 ENCOUNTER — Ambulatory Visit (HOSPITAL_COMMUNITY)
Admission: RE | Admit: 2016-12-22 | Discharge: 2016-12-22 | Disposition: A | Discharge status: Home / Self Care | Payer: Medicare HMO | Source: Ambulatory Visit | Attending: Radiology | Admitting: Radiology

## 2016-12-22 DIAGNOSIS — Z029 Encounter for administrative examinations, unspecified: Secondary | ICD-10-CM | POA: Diagnosis present

## 2016-12-22 DIAGNOSIS — D3610 Benign neoplasm of peripheral nerves and autonomic nervous system, unspecified: Secondary | ICD-10-CM

## 2016-12-22 LAB — CREATININE, SERUM
Creatinine, Ser: 0.95 mg/dL (ref 0.44–1.00)
GFR calc Af Amer: >60 mL/min (ref >60)
GFR calc non Af Amer: >60 mL/min (ref >60)

## 2016-12-22 MED ORDER — GADOBENATE DIMEGLUMINE 529 MG/ML IV SOLN
20.0000 mL | Freq: Once | INTRAVENOUS | Status: AC
  Administered 2016-12-22: 20 mL via INTRAVENOUS

## 2016-12-27 ENCOUNTER — Ambulatory Visit (INDEPENDENT_AMBULATORY_CARE_PROVIDER_SITE_OTHER): Payer: Commercial Managed Care - HMO | Admitting: Urology

## 2016-12-27 DIAGNOSIS — N200 Calculus of kidney: Secondary | ICD-10-CM

## 2017-01-04 ENCOUNTER — Other Ambulatory Visit: Payer: Self-pay | Admitting: Urology

## 2017-01-04 ENCOUNTER — Ambulatory Visit (HOSPITAL_COMMUNITY)
Admission: RE | Admit: 2017-01-04 | Discharge: 2017-01-04 | Disposition: A | Discharge status: Home / Self Care | Payer: Medicare HMO | Source: Ambulatory Visit | Attending: Urology | Admitting: Urology

## 2017-01-04 DIAGNOSIS — Z09 Encounter for follow-up examination after completed treatment for conditions other than malignant neoplasm: Secondary | ICD-10-CM | POA: Insufficient documentation

## 2017-01-04 DIAGNOSIS — Z87442 Personal history of urinary calculi: Secondary | ICD-10-CM | POA: Insufficient documentation

## 2017-01-04 DIAGNOSIS — N200 Calculus of kidney: Secondary | ICD-10-CM | POA: Diagnosis present

## 2017-02-06 ENCOUNTER — Encounter: Payer: Self-pay | Admitting: Radiation Oncology

## 2017-02-08 ENCOUNTER — Ambulatory Visit: Payer: Medicare Other

## 2017-02-08 ENCOUNTER — Ambulatory Visit
Admission: RE | Admit: 2017-02-08 | Discharge: 2017-02-08 | Disposition: A | Discharge status: Home / Self Care | Payer: Medicare Other | Source: Ambulatory Visit | Attending: Radiation Oncology | Admitting: Radiation Oncology

## 2017-02-08 HISTORY — DX: Personal history of irradiation: Z92.3

## 2017-06-27 ENCOUNTER — Ambulatory Visit (INDEPENDENT_AMBULATORY_CARE_PROVIDER_SITE_OTHER): Payer: Medicare HMO | Admitting: Urology

## 2017-06-27 DIAGNOSIS — N200 Calculus of kidney: Secondary | ICD-10-CM | POA: Diagnosis not present

## 2017-08-31 ENCOUNTER — Encounter (HOSPITAL_COMMUNITY): Payer: Self-pay | Admitting: Emergency Medicine

## 2017-08-31 ENCOUNTER — Emergency Department (HOSPITAL_COMMUNITY): Payer: Medicare HMO

## 2017-08-31 ENCOUNTER — Emergency Department (HOSPITAL_COMMUNITY)
Admission: EM | Admit: 2017-08-31 | Discharge: 2017-08-31 | Disposition: A | Discharge status: Skilled Nursing Facility | Payer: Medicare HMO | Attending: Emergency Medicine | Admitting: Emergency Medicine

## 2017-08-31 DIAGNOSIS — E039 Hypothyroidism, unspecified: Secondary | ICD-10-CM | POA: Insufficient documentation

## 2017-08-31 DIAGNOSIS — C719 Malignant neoplasm of brain, unspecified: Secondary | ICD-10-CM | POA: Diagnosis not present

## 2017-08-31 DIAGNOSIS — I259 Chronic ischemic heart disease, unspecified: Secondary | ICD-10-CM | POA: Diagnosis not present

## 2017-08-31 DIAGNOSIS — I10 Essential (primary) hypertension: Secondary | ICD-10-CM | POA: Insufficient documentation

## 2017-08-31 DIAGNOSIS — M545 Low back pain: Secondary | ICD-10-CM | POA: Diagnosis present

## 2017-08-31 DIAGNOSIS — M5442 Lumbago with sciatica, left side: Secondary | ICD-10-CM | POA: Diagnosis not present

## 2017-08-31 DIAGNOSIS — Z79899 Other long term (current) drug therapy: Secondary | ICD-10-CM | POA: Insufficient documentation

## 2017-08-31 MED ORDER — HYDROCODONE-ACETAMINOPHEN 5-325 MG PO TABS: 1.0000 | ORAL_TABLET | Freq: Four times a day (QID) | ORAL | 0 refills | Status: DC | PRN

## 2017-08-31 MED ORDER — METHYLPREDNISOLONE 4 MG PO TBPK: ORAL_TABLET | ORAL | 0 refills | Status: DC

## 2017-08-31 MED ORDER — LIDOCAINE 5 % EX PTCH: 1.0000 | MEDICATED_PATCH | CUTANEOUS | 0 refills | Status: DC

## 2017-08-31 NOTE — ED Provider Notes (Signed)
[place] DEPT Provider Note   CSN: [id number] Arrival date & time: 08/31/17  1403     History   Chief Complaint Chief Complaint  Patient presents with  . Back Pain    HPI Gabriella Roth is a 56 y.o. female.  HPI 56 year old Caucasian female past medical history significant for brain tumor, CAD, obesity, hypertension presents to the emergency department today with complaints of low back pain.  Patient states the pain started yesterday after bending over.  Patient is ambulatory out of facility with a walker.  Patient denies any known injury.  States the pain radiates down her left buttocks to her left leg.  States that palpation, ambulation and sitting make the pain worse.  Nothing makes the pain better.  She has chronic pain medication including tramadol at home that helps just a little bit.  Patient does report history of a brain tumor that she states is "killed".  States that she did receive radiation for this years ago.  Patient denies any lower extremity edema.  Denies any other red flag symptoms including saddle paresthesias, urinary retention, loss of bowel or bladder, lower extremity paresthesias.  Patient denies any associated fevers, chills, chest pain, abdominal pain, nausea, emesis, urinary symptoms.  Patient is ambulatory but with some pain.  She has not tried anything for the pain prior to arrival. Past Medical History:  Diagnosis Date  . Cancer ([place])    Brain  . Coronary artery disease   . History of radiation therapy 08/01/2013   EBRT: Completed 45 Gy out of a planned 54 Gy, completed 08/01/13 [facility]  . Hypertension   . Hypothyroidism   . Obesity     There are no active problems to display for this patient.   Past Surgical History:  Procedure Laterality Date  . Arm Surgery    . CHOLECYSTECTOMY    . GASTRIC BYPASS    . TONSILLECTOMY      OB History    No data available       Home Medications    Prior to Admission  medications   Medication Sig Start Date End Date Taking? Authorizing Provider  alum & mag hydroxide-simeth (MAALOX/MYLANTA) 200-200-20 MG/5ML suspension Take 30 mLs by mouth 2 (two) times daily as needed for indigestion or heartburn.    [provider]  amoxicillin-clavulanate (AUGMENTIN) 875-125 MG tablet Take 1 tablet by mouth 2 (two) times daily. 07/09/15   Varney Biles, MD  bacitracin ointment Apply 1 application topically 2 (two) times daily. 07/09/15   Varney Biles, MD  benzonatate (TESSALON) 100 MG capsule Take 1 capsule (100 mg total) by mouth 3 (three) times daily as needed for cough. 09/28/15   Everlene Balls, MD  bisacodyl (DULCOLAX) 10 MG suppository Place 10 mg rectally as needed for mild constipation or moderate constipation (*self administered*).    [provider]  citalopram (CELEXA) 40 MG tablet Take 40 mg by mouth daily.    [provider]  clotrimazole (LOTRIMIN) 1 % cream Apply to affected area 2 times daily.   Only use for one week.  Do not use inside vagina 10/03/16   Milton Ferguson, MD  doxycycline (VIBRAMYCIN) 100 MG capsule Take 1 capsule (100 mg total) by mouth 2 (two) times daily. One po bid x 7 days 10/03/16   Milton Ferguson, MD  guaifenesin (ROBITUSSIN) 100 MG/5ML syrup Take 200 mg by mouth 4 (four) times daily as needed for cough.    [provider]  levothyroxine (  SYNTHROID, LEVOTHROID) 50 MCG tablet Take 50 mcg by mouth daily before breakfast.    [provider]  lisinopril (PRINIVIL,ZESTRIL) 5 MG tablet Take 5 mg by mouth daily.    [provider]  magnesium hydroxide (MILK OF MAGNESIA) 400 MG/5ML suspension Take 15 mLs by mouth daily as needed for mild constipation.    [provider]  meclizine (ANTIVERT) 25 MG tablet Take 25 mg by mouth daily.    [provider]  metoprolol (LOPRESSOR) 50 MG tablet Take 50 mg by mouth 2 (two) times daily.    [provider]  Multiple Vitamin  (MULTIVITAMIN WITH MINERALS) TABS tablet Take 1 tablet by mouth daily.    [provider]  nystatin (MYCOSTATIN/NYSTOP) 100000 UNIT/GM POWD Apply 1-2 g topically daily. For 3 weeks then leave at bedside as needed.    [provider]  Olopatadine HCl (PATADAY) 0.2 % SOLN Apply 1 drop to eye daily.    [provider]  potassium chloride SA (K-DUR,KLOR-CON) 20 MEQ tablet Take 20 mEq by mouth daily.    [provider]  ranitidine (ZANTAC) 150 MG tablet Take 150 mg by mouth daily.    [provider]  solifenacin (VESICARE) 10 MG tablet Take 10 mg by mouth daily.    [provider]  traMADol (ULTRAM) 50 MG tablet Take 25 mg by mouth every 12 (twelve) hours as needed for moderate pain.    [provider]  triamterene-hydrochlorothiazide (MAXZIDE-25) 37.5-25 MG per tablet Take 1 tablet by mouth daily.    [provider]  verapamil (VERELAN PM) 240 MG 24 hr capsule Take 240 mg by mouth daily.    [provider]    Family History No family history on file.  Social History Social History   Tobacco Use  . Smoking status: Never Smoker  . Smokeless tobacco: Never Used  Substance Use Topics  . Alcohol use: No  . Drug use: No     Allergies   Avapro [irbesartan]; Codeine; Ibuprofen; Prilosec [omeprazole]; Vasotec [enalapril]; Vistaril [hydroxyzine hcl]; Zyrtec [cetirizine]; Bactrim [sulfamethoxazole-trimethoprim]; Clindamycin/lincomycin; and Keflex [cephalexin]   Review of Systems Review of Systems  Constitutional: Negative for chills and fever.  Eyes: Negative for visual disturbance.  Respiratory: Negative for cough and shortness of breath.   Cardiovascular: Negative for chest pain.  Gastrointestinal: Negative for abdominal pain, diarrhea, nausea and vomiting.  Genitourinary: Negative for dysuria, flank pain, frequency, pelvic pain and urgency.  Musculoskeletal: Positive for arthralgias, back pain and myalgias.  Negative for gait problem, joint swelling, neck pain and neck stiffness.  Skin: Negative for color change and rash.  Neurological: Negative for dizziness, weakness, light-headedness, numbness and headaches.     Physical Exam Updated Vital Signs BP (!) 147/79   Pulse 69   Temp 97.8 F (36.6 C) (Oral)   Resp 16   SpO2 92%   Physical Exam  Constitutional: She is oriented to person, place, and time. She appears well-developed and well-nourished. No distress.  Morbid obesity  HENT:  Head: Normocephalic and atraumatic.  Eyes: Right eye exhibits no discharge. Left eye exhibits no discharge. No scleral icterus.  Neck: Normal range of motion. Neck supple.  No c spine midline tenderness. No paraspinal tenderness. No deformities or step offs noted. Full ROM. Supple. No nuchal rigidity.    Pulmonary/Chest: No respiratory distress.  Musculoskeletal: Normal range of motion.  Patient with midline L-spine tenderness and bilateral paraspinal tenderness with pain that radiates to palpation down her left buttocks into  her left leg.  Positive straight leg raise test on the left that reproduces pain.  Patient has no midline T-spine tenderness.  There is no obvious deformity, step-offs, crepitus, erythema noted.  No rashes noted.  Pain is worse with palpation.  DP pulses are 2+ bilaterally.  Brisk cap refill.  Neurological: She is alert and oriented to person, place, and time.  Sensation intact in all dermatomes in the lower extremities.  Strength is 5 out of 5 in lower extremities.  Patellar reflexes are normal.  Patient is able to ambulate with a limp.  Uses a walker at baseline.  Skin: Skin is warm and dry. Capillary refill takes less than 2 seconds. No pallor.  Psychiatric: Her behavior is normal. Judgment and thought content normal.  Nursing note and vitals reviewed.    ED Treatments / Results  Labs (all labs ordered are listed, but only abnormal results are displayed) Labs Reviewed - No data to  display  EKG  EKG Interpretation None       Radiology Dg Lumbar Spine Complete  Result Date: 08/31/2017 CLINICAL DATA:  Low back pain EXAM: LUMBAR SPINE - COMPLETE 4+ VIEW COMPARISON:  None. FINDINGS: Four views study shows loss of disc height at L4-5 with grade 1-2 anterolisthesis of L4 on 5. No evidence for an acute fracture. SI joints are unremarkable. IMPRESSION: Chronic spondylolisthesis at L4-5. Electronically Signed   By: Misty Stanley M.D.   On: 08/31/2017 18:46    Procedures Procedures (including critical care time)  Medications Ordered in ED Medications - No data to display   Initial Impression / Assessment and Plan / ED Course  I have reviewed the triage vital signs and the nursing notes.  Pertinent labs & imaging results that were available during my care of the patient were reviewed by me and considered in my medical decision making (see chart for details).     Patient with low back pain that started yesterday without any known trauma.  Patient with significant morbid obesity.  No neurological deficits and normal neuro exam.  No rashes noted.  Pain is worse with palpation of the lumbar and paraspinal region.  Positive straight leg raise test on the left that seems consistent with sciatic pain.  Patient can walk but states is painful.  Patient does have a history of a brain tumor.  Did perform x-ray to rule out any metastasis to lumbar spine which showed chronic spondylolisthesis.  No loss of bowel or bladder control.  No concern for cauda equina.  No fever, night sweats, weight loss, IVDU.  Patient denies any urinary symptoms that would be consistent with a urinary tract infection or pyelonephritis.  Doubt pyelonephritis given no fevers or no CVA tenderness to palpation.  RICE protocol and pain medicine indicated and discussed with patient.  She given lidocaine patches in encouraged exercises.  Was also seen by my attending Dr. Roderic Palau who recommends stronger pain med and  steroid pack given sciatic component.  Discussed with patient very strict return precautions and follow-up with her primary care doctor.  Pt is hemodynamically stable, in NAD, & able to ambulate in the ED. Evaluation does not show pathology that would require ongoing emergent intervention or inpatient treatment. I explained the diagnosis to the patient. Pain has been managed & has no complaints prior to dc. Pt is comfortable with above plan and is stable for discharge at this time. All questions were answered prior to disposition. Strict return precautions for f/u to the ED were  discussed. Encouraged follow up with PCP.    Final Clinical Impressions(s) / ED Diagnoses   Final diagnoses:  Acute bilateral low back pain with left-sided sciatica    ED Discharge Orders        Ordered    methylPREDNISolone (MEDROL DOSEPAK) 4 MG TBPK tablet     08/31/17 1901    HYDROcodone-acetaminophen (NORCO) 5-325 MG tablet  Every 6 hours PRN     08/31/17 1901    lidocaine (LIDODERM) 5 %  Every 24 hours     08/31/17 1901       Aaron Edelman 08/31/17 1910    Milton Ferguson, MD 09/01/17 1458

## 2017-08-31 NOTE — ED Triage Notes (Addendum)
Per EMS, patient from [facility], c/o atraumatic lower back pain since yesterday. Ambulatory with walker. Denies injury. Patient reports SOB on exertion "for forever." Denies chest pain and cough. Denies any new SOB sx today.  BP 109/60 HR 80 RR 18

## 2017-08-31 NOTE — Discharge Instructions (Signed)
Your x-ray does show chronic changes in the low back which could be causing her symptoms.  Her symptoms seem consistent with sciatic pain.  Have given you a short course of steroids to take to help with inflammation.  Would also recommend you using the Lidoderm patch on the specific part of the back that hurts.  Have also given you a short course of hydrocodone to take for pain.  This medication will make you drowsy so do not drive with it.  Would also recommend continue taking NSAIDs for pain.  It is very important that you follow-up with your primary care doctor next week and they may run further testing or imaging if symptoms persist.  May also follow-up with orthopedic doctor if they feel that this is necessary.  Workup has been normal. Please take medications as prescribed and instructed.  SEEK IMMEDIATE MEDICAL ATTENTION IF: New numbness, tingling, weakness, or problem with the use of your arms or legs.  Severe back pain not relieved with medications.  Change in bowel or bladder control.  Urinary retention.  Numbness in your groin.  Increasing pain in any areas of the body (such as chest or abdominal pain).  Shortness of breath, dizziness or fainting.  Nausea (feeling sick to your stomach), vomiting, fever, or sweats.

## 2017-08-31 NOTE — ED Notes (Signed)
[facility] called for transport.  

## 2017-11-27 ENCOUNTER — Other Ambulatory Visit: Payer: Self-pay | Admitting: Internal Medicine

## 2017-11-27 DIAGNOSIS — Z1231 Encounter for screening mammogram for malignant neoplasm of breast: Secondary | ICD-10-CM

## 2017-11-30 ENCOUNTER — Other Ambulatory Visit (HOSPITAL_COMMUNITY): Payer: Self-pay | Admitting: Radiology

## 2017-11-30 DIAGNOSIS — D333 Benign neoplasm of cranial nerves: Secondary | ICD-10-CM

## 2017-12-15 ENCOUNTER — Ambulatory Visit: Payer: Medicare HMO

## 2017-12-20 ENCOUNTER — Ambulatory Visit (HOSPITAL_COMMUNITY)
Admission: RE | Admit: 2017-12-20 | Discharge: 2017-12-20 | Disposition: A | Discharge status: Home / Self Care | Payer: Medicare HMO | Source: Ambulatory Visit | Attending: Radiology | Admitting: Radiology

## 2017-12-20 ENCOUNTER — Encounter (HOSPITAL_COMMUNITY): Payer: Self-pay | Admitting: Radiology

## 2017-12-20 DIAGNOSIS — K148 Other diseases of tongue: Secondary | ICD-10-CM | POA: Diagnosis not present

## 2017-12-20 DIAGNOSIS — G319 Degenerative disease of nervous system, unspecified: Secondary | ICD-10-CM | POA: Diagnosis not present

## 2017-12-20 DIAGNOSIS — D333 Benign neoplasm of cranial nerves: Secondary | ICD-10-CM | POA: Insufficient documentation

## 2017-12-20 LAB — CREATININE, SERUM: CREATININE: 1 mg/dL (ref 0.44–1.00)

## 2017-12-20 MED ORDER — GADOBENATE DIMEGLUMINE 529 MG/ML IV SOLN
20.0000 mL | Freq: Once | INTRAVENOUS | Status: AC | PRN
  Administered 2017-12-20: 20 mL via INTRAVENOUS

## 2018-01-09 ENCOUNTER — Ambulatory Visit: Payer: Medicare HMO

## 2018-01-09 ENCOUNTER — Ambulatory Visit
Admission: RE | Admit: 2018-01-09 | Discharge: 2018-01-09 | Disposition: A | Discharge status: Home / Self Care | Payer: Medicare HMO | Source: Ambulatory Visit | Attending: Internal Medicine | Admitting: Internal Medicine

## 2018-01-09 DIAGNOSIS — Z1231 Encounter for screening mammogram for malignant neoplasm of breast: Secondary | ICD-10-CM

## 2018-01-17 ENCOUNTER — Inpatient Hospital Stay (HOSPITAL_COMMUNITY)
Admission: EM | Admit: 2018-01-17 | Discharge: 2018-01-22 | DRG: 189 | Disposition: A | Discharge status: Nursing Home (Includes ICF) | Payer: Medicare HMO | Attending: Internal Medicine | Admitting: Internal Medicine

## 2018-01-17 ENCOUNTER — Encounter (HOSPITAL_COMMUNITY): Payer: Self-pay | Admitting: Emergency Medicine

## 2018-01-17 ENCOUNTER — Emergency Department (HOSPITAL_COMMUNITY): Payer: Medicare HMO

## 2018-01-17 DIAGNOSIS — Z886 Allergy status to analgesic agent status: Secondary | ICD-10-CM

## 2018-01-17 DIAGNOSIS — Z888 Allergy status to other drugs, medicaments and biological substances status: Secondary | ICD-10-CM

## 2018-01-17 DIAGNOSIS — I272 Pulmonary hypertension, unspecified: Secondary | ICD-10-CM

## 2018-01-17 DIAGNOSIS — J9621 Acute and chronic respiratory failure with hypoxia: Principal | ICD-10-CM | POA: Diagnosis present

## 2018-01-17 DIAGNOSIS — M549 Dorsalgia, unspecified: Secondary | ICD-10-CM

## 2018-01-17 DIAGNOSIS — Z9119 Patient's noncompliance with other medical treatment and regimen: Secondary | ICD-10-CM

## 2018-01-17 DIAGNOSIS — I2510 Atherosclerotic heart disease of native coronary artery without angina pectoris: Secondary | ICD-10-CM | POA: Diagnosis present

## 2018-01-17 DIAGNOSIS — Z79899 Other long term (current) drug therapy: Secondary | ICD-10-CM

## 2018-01-17 DIAGNOSIS — Z5329 Procedure and treatment not carried out because of patient's decision for other reasons: Secondary | ICD-10-CM | POA: Diagnosis present

## 2018-01-17 DIAGNOSIS — Z9109 Other allergy status, other than to drugs and biological substances: Secondary | ICD-10-CM

## 2018-01-17 DIAGNOSIS — I10 Essential (primary) hypertension: Secondary | ICD-10-CM | POA: Diagnosis present

## 2018-01-17 DIAGNOSIS — J9601 Acute respiratory failure with hypoxia: Secondary | ICD-10-CM | POA: Diagnosis present

## 2018-01-17 DIAGNOSIS — Z79891 Long term (current) use of opiate analgesic: Secondary | ICD-10-CM

## 2018-01-17 DIAGNOSIS — Z79890 Hormone replacement therapy (postmenopausal): Secondary | ICD-10-CM

## 2018-01-17 DIAGNOSIS — R0902 Hypoxemia: Secondary | ICD-10-CM | POA: Diagnosis not present

## 2018-01-17 DIAGNOSIS — Z885 Allergy status to narcotic agent status: Secondary | ICD-10-CM

## 2018-01-17 DIAGNOSIS — Z6841 Body Mass Index (BMI) 40.0 and over, adult: Secondary | ICD-10-CM

## 2018-01-17 DIAGNOSIS — I509 Heart failure, unspecified: Secondary | ICD-10-CM | POA: Diagnosis present

## 2018-01-17 DIAGNOSIS — W1830XA Fall on same level, unspecified, initial encounter: Secondary | ICD-10-CM | POA: Diagnosis present

## 2018-01-17 DIAGNOSIS — Z9884 Bariatric surgery status: Secondary | ICD-10-CM

## 2018-01-17 DIAGNOSIS — Z85841 Personal history of malignant neoplasm of brain: Secondary | ICD-10-CM

## 2018-01-17 DIAGNOSIS — M62838 Other muscle spasm: Secondary | ICD-10-CM | POA: Diagnosis present

## 2018-01-17 DIAGNOSIS — E662 Morbid (severe) obesity with alveolar hypoventilation: Secondary | ICD-10-CM | POA: Diagnosis present

## 2018-01-17 DIAGNOSIS — I110 Hypertensive heart disease with heart failure: Secondary | ICD-10-CM | POA: Diagnosis present

## 2018-01-17 DIAGNOSIS — M25511 Pain in right shoulder: Secondary | ICD-10-CM | POA: Diagnosis present

## 2018-01-17 DIAGNOSIS — Z9049 Acquired absence of other specified parts of digestive tract: Secondary | ICD-10-CM

## 2018-01-17 DIAGNOSIS — D333 Benign neoplasm of cranial nerves: Secondary | ICD-10-CM | POA: Diagnosis present

## 2018-01-17 DIAGNOSIS — E039 Hypothyroidism, unspecified: Secondary | ICD-10-CM | POA: Diagnosis present

## 2018-01-17 DIAGNOSIS — I2721 Secondary pulmonary arterial hypertension: Secondary | ICD-10-CM | POA: Diagnosis present

## 2018-01-17 DIAGNOSIS — R42 Dizziness and giddiness: Secondary | ICD-10-CM | POA: Diagnosis present

## 2018-01-17 DIAGNOSIS — K219 Gastro-esophageal reflux disease without esophagitis: Secondary | ICD-10-CM | POA: Diagnosis present

## 2018-01-17 DIAGNOSIS — Z923 Personal history of irradiation: Secondary | ICD-10-CM

## 2018-01-17 LAB — BASIC METABOLIC PANEL
Anion gap: 10 (ref 5–15)
BUN: 12 mg/dL (ref 6–20)
CHLORIDE: 105 mmol/L (ref 101–111)
CO2: 25 mmol/L (ref 22–32)
Calcium: 9.1 mg/dL (ref 8.9–10.3)
Creatinine, Ser: 1.07 mg/dL — ABNORMAL HIGH (ref 0.44–1.00)
GFR calc Af Amer: >60 mL/min (ref >60)
GFR calc non Af Amer: 58 mL/min — ABNORMAL LOW (ref >60)
Glucose, Bld: 102 mg/dL — ABNORMAL HIGH (ref 65–99)
POTASSIUM: 3.8 mmol/L (ref 3.5–5.1)
SODIUM: 140 mmol/L (ref 135–145)

## 2018-01-17 LAB — I-STAT TROPONIN, ED: Troponin i, poc: 0 ng/mL (ref 0.00–0.08)

## 2018-01-17 LAB — CBC
HEMATOCRIT: 48.5 % — AB (ref 36.0–46.0)
HEMOGLOBIN: 14.7 g/dL (ref 12.0–15.0)
MCH: 27.1 pg (ref 26.0–34.0)
MCHC: 30.3 g/dL (ref 30.0–36.0)
MCV: 89.5 fL (ref 78.0–100.0)
Platelets: 237 10*3/uL (ref 150–400)
RBC: 5.42 MIL/uL — AB (ref 3.87–5.11)
RDW: 16 % — ABNORMAL HIGH (ref 11.5–15.5)
WBC: 8.3 10*3/uL (ref 4.0–10.5)

## 2018-01-17 LAB — BRAIN NATRIURETIC PEPTIDE: B NATRIURETIC PEPTIDE 5: 853 pg/mL — AB (ref 0.0–100.0)

## 2018-01-17 MED ORDER — IOPAMIDOL (ISOVUE-370) INJECTION 76%
100.0000 mL | Freq: Once | INTRAVENOUS | Status: AC | PRN
  Administered 2018-01-17: 100 mL via INTRAVENOUS

## 2018-01-17 MED ORDER — IOPAMIDOL (ISOVUE-370) INJECTION 76%
INTRAVENOUS | Status: AC
Start: 2018-01-17 — End: 2018-01-18
  Filled 2018-01-17: qty 100

## 2018-01-17 NOTE — ED Notes (Signed)
Patient transported to X-ray 

## 2018-01-17 NOTE — ED Notes (Signed)
IV team at bedside 

## 2018-01-17 NOTE — ED Provider Notes (Signed)
[facility] EMERGENCY DEPARTMENT Provider Note   CSN: [id number] Arrival date & time: 01/17/18  1446     History   Chief Complaint Chief Complaint  Patient presents with  . Fall  . Shoulder Pain    HPI Gabriella Roth is a 57 y.o. female with a history of cranial nerve schwannoma status post radiation, hypertension, hypothyroid, obesity, CAD, who presents today for evaluation of right shoulder pain.  She reports that one week ago she had an episode where she blacked out for approximately 2 minutes and fell on her back.  She reports that due to her brain tumor she normally has dizziness and this is unchanged from her baseline.  She reports that she has been having intermittent right shoulder pain since her fall.  Her pain is worse with palpation.  She is concerned that she dislocated her shoulder during this fall 1 week ago.  Patient reports that yesterday she was seen by her urologist for a kidney stone evaluation, she reports that she has been feeling short of breath for the past few weeks and that yesterday, when they laid her down for the scan, she became very short of breath and had to sit up.  She denies coughing more than usual recently.  During triage she was noted to be hypoxic satting 88% on room air.  She is not normally on oxygen.  HPI  Past Medical History:  Diagnosis Date  . Cancer ([place])    Brain  . Coronary artery disease   . History of radiation therapy 08/01/2013   EBRT: Completed 45 Gy out of a planned 54 Gy, completed 08/01/13 [facility]  . Hypertension   . Hypothyroidism   . Obesity     Patient Active Problem List   Diagnosis Date Noted  . Acute respiratory failure with hypoxia ([place]) 01/18/2018  . HTN (hypertension) 01/18/2018    Past Surgical History:  Procedure Laterality Date  . Arm Surgery    . CHOLECYSTECTOMY    . GASTRIC BYPASS    . TONSILLECTOMY       OB History   None      Home Medications    Prior to Admission  medications   Medication Sig Start Date End Date Taking? Authorizing Provider  alum & mag hydroxide-simeth (MAALOX/MYLANTA) 200-200-20 MG/5ML suspension Take 30 mLs by mouth 2 (two) times daily as needed for indigestion or heartburn.    [provider]  benzonatate (TESSALON) 100 MG capsule Take 1 capsule (100 mg total) by mouth 3 (three) times daily as needed for cough. 09/28/15   Everlene Balls, MD  bisacodyl (DULCOLAX) 10 MG suppository Place 10 mg rectally as needed for mild constipation or moderate constipation (*self administered*).    [provider]  citalopram (CELEXA) 40 MG tablet Take 40 mg by mouth daily.    [provider]  clotrimazole (LOTRIMIN) 1 % cream Apply to affected area 2 times daily.   Only use for one week.  Do not use inside vagina 10/03/16   Milton Ferguson, MD  doxycycline (VIBRAMYCIN) 100 MG capsule Take 1 capsule (100 mg total) by mouth 2 (two) times daily. One po bid x 7 days 10/03/16   Milton Ferguson, MD  guaifenesin (ROBITUSSIN) 100 MG/5ML syrup Take 200 mg by mouth 4 (four) times daily as needed for cough.    [provider]  HYDROcodone-acetaminophen (NORCO) 5-325 MG tablet Take 1 tablet by mouth every 6 (six) hours as needed. 08/31/17   Ocie Cornfield  T, PA-C  levothyroxine (SYNTHROID, LEVOTHROID) 50 MCG tablet Take 50 mcg by mouth daily before breakfast.    [provider]  lidocaine (LIDODERM) 5 % Place 1 patch onto the skin daily. Remove & Discard patch within 12 hours or as directed by MD 08/31/17   Ocie Cornfield T, PA-C  lisinopril (PRINIVIL,ZESTRIL) 5 MG tablet Take 5 mg by mouth daily.    [provider]  magnesium hydroxide (MILK OF MAGNESIA) 400 MG/5ML suspension Take 15 mLs by mouth daily as needed for mild constipation.    [provider]  meclizine (ANTIVERT) 25 MG tablet Take 25 mg by mouth daily.    [provider]  methylPREDNISolone (MEDROL DOSEPAK) 4 MG TBPK tablet Take as  directed 08/31/17   Ocie Cornfield T, PA-C  metoprolol (LOPRESSOR) 50 MG tablet Take 50 mg by mouth 2 (two) times daily.    [provider]  Multiple Vitamin (MULTIVITAMIN WITH MINERALS) TABS tablet Take 1 tablet by mouth daily.    [provider]  nystatin (MYCOSTATIN/NYSTOP) 100000 UNIT/GM POWD Apply 1-2 g topically daily. For 3 weeks then leave at bedside as needed.    [provider]  Olopatadine HCl (PATADAY) 0.2 % SOLN Apply 1 drop to eye daily.    [provider]  potassium chloride SA (K-DUR,KLOR-CON) 20 MEQ tablet Take 20 mEq by mouth daily.    [provider]  ranitidine (ZANTAC) 150 MG tablet Take 150 mg by mouth daily.    [provider]  solifenacin (VESICARE) 10 MG tablet Take 10 mg by mouth daily.    [provider]  traMADol (ULTRAM) 50 MG tablet Take 25 mg by mouth every 12 (twelve) hours as needed for moderate pain.    [provider]  triamterene-hydrochlorothiazide (MAXZIDE-25) 37.5-25 MG per tablet Take 1 tablet by mouth daily.    [provider]  verapamil (VERELAN PM) 240 MG 24 hr capsule Take 240 mg by mouth daily.    [provider]    Family History History reviewed. No pertinent family history.  Social History Social History   Tobacco Use  . Smoking status: Never Smoker  . Smokeless tobacco: Never Used  Substance Use Topics  . Alcohol use: No  . Drug use: No     Allergies   Avapro [irbesartan]; Codeine; Ibuprofen; Prilosec [omeprazole]; Vasotec [enalapril]; Vistaril [hydroxyzine hcl]; Zyrtec [cetirizine]; Bactrim [sulfamethoxazole-trimethoprim]; Clindamycin/lincomycin; and Keflex [cephalexin]   Review of Systems Review of Systems  Constitutional: Negative for chills and fever.  Respiratory: Positive for shortness of breath. Negative for chest tightness and wheezing.   Cardiovascular: Negative for palpitations and leg swelling.  Gastrointestinal: Negative for  abdominal pain, nausea and vomiting.  Musculoskeletal:       Pain in right shoulder  Neurological: Positive for dizziness (At baseline). Negative for weakness (at baseline).  All other systems reviewed and are negative.    Physical Exam Updated Vital Signs BP 126/70   Pulse 77   Temp 98.2 F (36.8 C)   Resp (!) 21   SpO2 98%   Physical Exam  Constitutional: She is oriented to person, place, and time. She appears well-developed and well-nourished. No distress.  HENT:  Head: Normocephalic and atraumatic.  Mouth/Throat: Oropharynx is clear and moist.  Eyes: Pupils are equal, round, and reactive to light. Conjunctivae are normal.  Neck: Normal range of motion. Neck supple.  Cardiovascular: Normal rate, regular rhythm, normal heart sounds and intact distal pulses.  No murmur heard. +1 pitting edema  Pulmonary/Chest: Effort normal and breath sounds normal. No respiratory distress.  While in the room I turned off patient's oxygen, with good waveform she dropped to 88% on room air.  Oxygen was turned back on and she rose to 96%.   Abdominal: Soft. Bowel sounds are normal. She exhibits no distension. There is no tenderness. There is no guarding.  Musculoskeletal: She exhibits no edema or deformity.  Right shoulder: Patient is able to lift her arm above her head without pain.  Patient right shoulder pain is both re-created and exacerbated with palpation over the right posterior shoulder consistent with the trapezius muscle.  No crepitus or deformities palpated.  Neurological: She is alert and oriented to person, place, and time.  Skin: Skin is warm and dry.  Psychiatric: She has a normal mood and affect. Her behavior is normal.  Nursing note and vitals reviewed.    ED Treatments / Results  Labs (all labs ordered are listed, but only abnormal results are displayed) Labs Reviewed  BASIC METABOLIC PANEL - Abnormal; Notable for the following components:      Result Value   Glucose, Bld  102 (*)    Creatinine, Ser 1.07 (*)    GFR calc non Af Amer 58 (*)    All other components within normal limits  CBC - Abnormal; Notable for the following components:   RBC 5.42 (*)    HCT 48.5 (*)    RDW 16.0 (*)    All other components within normal limits  BRAIN NATRIURETIC PEPTIDE - Abnormal; Notable for the following components:   B Natriuretic Peptide 853.0 (*)    All other components within normal limits  HIV ANTIBODY (ROUTINE TESTING)  BASIC METABOLIC PANEL  I-STAT TROPONIN, ED    EKG EKG Interpretation  Date/Time:  Wednesday January 17 2018 15:21:32 EDT Ventricular Rate:  76 PR Interval:  182 QRS Duration: 82 QT Interval:  382 QTC Calculation: 429 R Axis:   71 Text Interpretation:  Normal sinus rhythm ST & T wave abnormality, consider inferior ischemia ST & T wave abnormality, consider anterolateral ischemia Abnormal ECG No old tracing to compare Confirmed by Dorie Rank [phone]) on 01/17/2018 4:04:31 PM   Radiology Dg Chest 2 View  Result Date: 01/17/2018 CLINICAL DATA:  Syncopal episode today.  Shortness of breath. EXAM: CHEST - 2 VIEW COMPARISON:  10/03/2016 FINDINGS: The heart size and mediastinal contours are within normal limits. Both lungs are clear. The visualized skeletal structures are unremarkable. IMPRESSION: Stable exam.  No active cardiopulmonary disease. Electronically Signed   By: Earle Gell M.D.   On: 01/17/2018 17:13   Dg Shoulder Right  Result Date: 01/17/2018 CLINICAL DATA:  Fall in parking lot. Right shoulder pain. Initial encounter. EXAM: RIGHT SHOULDER - 2+ VIEW COMPARISON:  None. FINDINGS: There is no evidence of fracture or dislocation. There is no evidence of arthropathy or other focal bone abnormality. Soft tissues are unremarkable. IMPRESSION: Negative. Electronically Signed   By: Earle Gell M.D.   On: 01/17/2018 17:14   Ct Angio Chest Pe W/cm &/or Wo Cm  Result Date: 01/17/2018 CLINICAL DATA:  57 y/o  F; hypoxia. EXAM: CT ANGIOGRAPHY CHEST  WITH CONTRAST TECHNIQUE: Multidetector CT imaging of the chest was performed using the standard protocol during bolus administration of intravenous contrast. Multiplanar CT image reconstructions and MIPs were obtained to evaluate the vascular anatomy. CONTRAST:  111mL ISOVUE-370 IOPAMIDOL (ISOVUE-370) INJECTION 76% COMPARISON:  01/17/2018 chest radiograph FINDINGS: Cardiovascular: Mild cardiomegaly. Normal caliber thoracic aorta with mild  atherosclerosis. Enlarged main pulmonary artery measuring up to 3.7 cm. No pericardial effusion. Satisfactory opacification of the pulmonary arteries. No pulmonary embolus identified. Mediastinum/Nodes: No enlarged mediastinal, hilar, or axillary lymph nodes. Thyroid gland, trachea, and esophagus demonstrate no significant findings. Lungs/Pleura: Mosaic attenuation of the lung parenchyma. Platelike opacity at the right lung base. No pleural effusion or pneumothorax. Upper Abdomen: Partially visualized gastric bypass postsurgical changes. Cholecystectomy. Nonspecific densities in the right lobe of liver, probably small granuloma. Musculoskeletal: No chest wall abnormality. No acute or significant osseous findings. Review of the MIP images confirms the above findings. IMPRESSION: 1. No pulmonary embolus identified. 2. Mosaic attenuation of lung parenchyma may represent small vessel or small airways disease. 3. Platelike opacity at the right lung base, probably atelectasis. 4. Mild cardiomegaly and enlarged main pulmonary artery which may represent pulmonary artery hypertension. Electronically Signed   By: Kristine Garbe M.D.   On: 01/17/2018 22:59    Procedures Procedures (including critical care time)  Medications Ordered in ED Medications  iopamidol (ISOVUE-370) 76 % injection (has no administration in time range)                  iopamidol (ISOVUE-370) 76 % injection 100 mL (100 mLs Intravenous Contrast Given 01/17/18 2229)     Initial Impression /  Assessment and Plan / ED Course  I have reviewed the triage vital signs and the nursing notes.  Pertinent labs & imaging results that were available during my care of the patient were reviewed by me and considered in my medical decision making (see chart for details).  Clinical Course as of Jan 19 107  Thu Jan 18, 2018  0103 Spoke with Dr. Alcario Drought who will admit patient.    [EH]    Clinical Course User Index [EH] Lorin Glass, PA-C   Gabriella Brundage presents today for evaluation of 1 week of right superior posterior shoulder pain after a fall 1 week ago.  X-rays were obtained without acute abnormalities, pain is consistent with trapezius muscle spasm.  At triage patient was noted to be hypoxic at 88%.  She is not normally on oxygen at home.  Chest x-ray obtained without acute abnormalities.  BNP mildly elevated 853.  Troponin 0.00.  EKG obtained and reviewed.  Patient is a nursing home resident, sedentary, has active cancer, at high risk for blood clot.  CTA chest PE study obtained with out PE.  CT did show evidence of possible pulmonary artery hypertension. I turned off patient's oxygen while monitored, with a good wave form she dropped to 88% on room air. Based on new oxygen requirement hospitalist was consulted.  I spoke with Dr. Alcario Drought who agreed to admit patient.    This patient was seen as a shared visit with Dr. Tomi Bamberger.   Final Clinical Impressions(s) / ED Diagnoses   Final diagnoses:  Hypoxia  Musculoskeletal back pain    ED Discharge Orders    None       Ollen Gross 01/18/18 0114    Dorie Rank, MD 01/18/18 1242

## 2018-01-17 NOTE — ED Notes (Signed)
This RN called lab, they will add the BNP onto the labs

## 2018-01-17 NOTE — ED Notes (Signed)
Per lab, send a label down for the pt to have the BNP added on.

## 2018-01-17 NOTE — ED Notes (Signed)
When rounding on pt, pt was found to be eating sandwich she had packed from home. No distress noted, VS WDL

## 2018-01-17 NOTE — ED Notes (Signed)
Patient transported to CT 

## 2018-01-17 NOTE — ED Notes (Signed)
Per EMS- pt from [facility] for a fall last Tuesday where she blacked out, has pain to right shoulder.   Upon assessment the pt is 88% on room air. States she does not wear oxygen. Hx of brain tumor. States she had a scan yesterday for kidney stone eval. Pt states she has felt short of breath now for a few weeks. Placed on 2L O2 via nasal cannula.

## 2018-01-18 ENCOUNTER — Other Ambulatory Visit (HOSPITAL_COMMUNITY): Payer: Medicare HMO

## 2018-01-18 ENCOUNTER — Other Ambulatory Visit: Payer: Self-pay

## 2018-01-18 DIAGNOSIS — I10 Essential (primary) hypertension: Secondary | ICD-10-CM | POA: Diagnosis not present

## 2018-01-18 DIAGNOSIS — Z85841 Personal history of malignant neoplasm of brain: Secondary | ICD-10-CM | POA: Diagnosis not present

## 2018-01-18 DIAGNOSIS — Z886 Allergy status to analgesic agent status: Secondary | ICD-10-CM | POA: Diagnosis not present

## 2018-01-18 DIAGNOSIS — Z9049 Acquired absence of other specified parts of digestive tract: Secondary | ICD-10-CM | POA: Diagnosis not present

## 2018-01-18 DIAGNOSIS — J9601 Acute respiratory failure with hypoxia: Secondary | ICD-10-CM | POA: Diagnosis not present

## 2018-01-18 DIAGNOSIS — M25511 Pain in right shoulder: Secondary | ICD-10-CM | POA: Diagnosis present

## 2018-01-18 DIAGNOSIS — M549 Dorsalgia, unspecified: Secondary | ICD-10-CM

## 2018-01-18 DIAGNOSIS — E662 Morbid (severe) obesity with alveolar hypoventilation: Secondary | ICD-10-CM | POA: Diagnosis present

## 2018-01-18 DIAGNOSIS — Z79890 Hormone replacement therapy (postmenopausal): Secondary | ICD-10-CM | POA: Diagnosis not present

## 2018-01-18 DIAGNOSIS — R0902 Hypoxemia: Secondary | ICD-10-CM

## 2018-01-18 DIAGNOSIS — Z9884 Bariatric surgery status: Secondary | ICD-10-CM | POA: Diagnosis not present

## 2018-01-18 DIAGNOSIS — Z9109 Other allergy status, other than to drugs and biological substances: Secondary | ICD-10-CM | POA: Diagnosis not present

## 2018-01-18 DIAGNOSIS — R42 Dizziness and giddiness: Secondary | ICD-10-CM | POA: Diagnosis present

## 2018-01-18 DIAGNOSIS — W1830XA Fall on same level, unspecified, initial encounter: Secondary | ICD-10-CM | POA: Diagnosis present

## 2018-01-18 DIAGNOSIS — I509 Heart failure, unspecified: Secondary | ICD-10-CM | POA: Diagnosis present

## 2018-01-18 DIAGNOSIS — J9621 Acute and chronic respiratory failure with hypoxia: Secondary | ICD-10-CM | POA: Diagnosis present

## 2018-01-18 DIAGNOSIS — Z9119 Patient's noncompliance with other medical treatment and regimen: Secondary | ICD-10-CM | POA: Diagnosis not present

## 2018-01-18 DIAGNOSIS — Z79891 Long term (current) use of opiate analgesic: Secondary | ICD-10-CM | POA: Diagnosis not present

## 2018-01-18 DIAGNOSIS — Z888 Allergy status to other drugs, medicaments and biological substances status: Secondary | ICD-10-CM | POA: Diagnosis not present

## 2018-01-18 DIAGNOSIS — Z6841 Body Mass Index (BMI) 40.0 and over, adult: Secondary | ICD-10-CM | POA: Diagnosis not present

## 2018-01-18 DIAGNOSIS — Z885 Allergy status to narcotic agent status: Secondary | ICD-10-CM | POA: Diagnosis not present

## 2018-01-18 DIAGNOSIS — I2721 Secondary pulmonary arterial hypertension: Secondary | ICD-10-CM | POA: Diagnosis present

## 2018-01-18 DIAGNOSIS — K219 Gastro-esophageal reflux disease without esophagitis: Secondary | ICD-10-CM | POA: Diagnosis present

## 2018-01-18 DIAGNOSIS — E039 Hypothyroidism, unspecified: Secondary | ICD-10-CM | POA: Diagnosis present

## 2018-01-18 DIAGNOSIS — Z923 Personal history of irradiation: Secondary | ICD-10-CM | POA: Diagnosis not present

## 2018-01-18 DIAGNOSIS — I110 Hypertensive heart disease with heart failure: Secondary | ICD-10-CM | POA: Diagnosis present

## 2018-01-18 DIAGNOSIS — I2510 Atherosclerotic heart disease of native coronary artery without angina pectoris: Secondary | ICD-10-CM | POA: Diagnosis present

## 2018-01-18 DIAGNOSIS — Z79899 Other long term (current) drug therapy: Secondary | ICD-10-CM | POA: Diagnosis not present

## 2018-01-18 DIAGNOSIS — I361 Nonrheumatic tricuspid (valve) insufficiency: Secondary | ICD-10-CM | POA: Diagnosis not present

## 2018-01-18 DIAGNOSIS — Z5329 Procedure and treatment not carried out because of patient's decision for other reasons: Secondary | ICD-10-CM | POA: Diagnosis present

## 2018-01-18 LAB — BASIC METABOLIC PANEL
ANION GAP: 12 (ref 5–15)
BUN: 12 mg/dL (ref 6–20)
CO2: 23 mmol/L (ref 22–32)
Calcium: 9.2 mg/dL (ref 8.9–10.3)
Chloride: 106 mmol/L (ref 101–111)
Creatinine, Ser: 1.05 mg/dL — ABNORMAL HIGH (ref 0.44–1.00)
GFR, EST NON AFRICAN AMERICAN: 59 mL/min — AB (ref >60)
Glucose, Bld: 99 mg/dL (ref 65–99)
POTASSIUM: 3.9 mmol/L (ref 3.5–5.1)
SODIUM: 141 mmol/L (ref 135–145)

## 2018-01-18 LAB — TROPONIN I

## 2018-01-18 LAB — HIV ANTIBODY (ROUTINE TESTING W REFLEX): HIV SCREEN 4TH GENERATION: NONREACTIVE

## 2018-01-18 MED ORDER — ACETAMINOPHEN 650 MG RE SUPP: 650.0000 mg | Freq: Four times a day (QID) | RECTAL | Status: DC | PRN

## 2018-01-18 MED ORDER — DICYCLOMINE HCL 20 MG PO TABS
20.0000 mg | ORAL_TABLET | Freq: Three times a day (TID) | ORAL | Status: DC
  Administered 2018-01-18 – 2018-01-21 (×10): 20 mg via ORAL
  Filled 2018-01-18 (×15): qty 1

## 2018-01-18 MED ORDER — FAMOTIDINE 20 MG PO TABS
20.0000 mg | ORAL_TABLET | Freq: Two times a day (BID) | ORAL | Status: DC
  Administered 2018-01-18 – 2018-01-22 (×10): 20 mg via ORAL
  Filled 2018-01-18 (×10): qty 1

## 2018-01-18 MED ORDER — ONDANSETRON HCL 4 MG PO TABS: 4.0000 mg | ORAL_TABLET | Freq: Four times a day (QID) | ORAL | Status: DC | PRN

## 2018-01-18 MED ORDER — CITALOPRAM HYDROBROMIDE 20 MG PO TABS
40.0000 mg | ORAL_TABLET | Freq: Every day | ORAL | Status: DC
Start: 2018-01-18 — End: 2018-01-23
  Administered 2018-01-18 – 2018-01-22 (×5): 40 mg via ORAL
  Filled 2018-01-18 (×3): qty 2
  Filled 2018-01-18: qty 4
  Filled 2018-01-18: qty 2

## 2018-01-18 MED ORDER — ACETAMINOPHEN 325 MG PO TABS
650.0000 mg | ORAL_TABLET | Freq: Four times a day (QID) | ORAL | Status: DC | PRN
  Administered 2018-01-19: 650 mg via ORAL
  Filled 2018-01-18: qty 2

## 2018-01-18 MED ORDER — LEVOTHYROXINE SODIUM 50 MCG PO TABS
50.0000 ug | ORAL_TABLET | Freq: Every day | ORAL | Status: DC
  Administered 2018-01-18 – 2018-01-22 (×5): 50 ug via ORAL
  Filled 2018-01-18 (×5): qty 1

## 2018-01-18 MED ORDER — POTASSIUM CHLORIDE CRYS ER 20 MEQ PO TBCR
20.0000 meq | EXTENDED_RELEASE_TABLET | Freq: Every day | ORAL | Status: DC
  Administered 2018-01-18 – 2018-01-22 (×5): 20 meq via ORAL
  Filled 2018-01-18 (×5): qty 1

## 2018-01-18 MED ORDER — DARIFENACIN HYDROBROMIDE ER 7.5 MG PO TB24
15.0000 mg | ORAL_TABLET | Freq: Every day | ORAL | Status: DC
  Administered 2018-01-18 – 2018-01-22 (×5): 15 mg via ORAL
  Filled 2018-01-18: qty 1
  Filled 2018-01-18 (×4): qty 2

## 2018-01-18 MED ORDER — HYDROCODONE-ACETAMINOPHEN 5-325 MG PO TABS
1.0000 | ORAL_TABLET | Freq: Four times a day (QID) | ORAL | Status: DC | PRN
  Administered 2018-01-18 – 2018-01-22 (×9): 1 via ORAL
  Filled 2018-01-18 (×9): qty 1

## 2018-01-18 MED ORDER — HYDROXYZINE HCL 25 MG PO TABS
25.0000 mg | ORAL_TABLET | Freq: Two times a day (BID) | ORAL | Status: DC
  Administered 2018-01-18 – 2018-01-22 (×9): 25 mg via ORAL
  Filled 2018-01-18 (×9): qty 1

## 2018-01-18 MED ORDER — ENOXAPARIN SODIUM 40 MG/0.4ML ~~LOC~~ SOLN
40.0000 mg | SUBCUTANEOUS | Status: DC
  Administered 2018-01-18 – 2018-01-22 (×5): 40 mg via SUBCUTANEOUS
  Filled 2018-01-18 (×6): qty 0.4

## 2018-01-18 MED ORDER — ONDANSETRON HCL 4 MG/2ML IJ SOLN: 4.0000 mg | Freq: Four times a day (QID) | INTRAMUSCULAR | Status: DC | PRN

## 2018-01-18 MED ORDER — FUROSEMIDE 10 MG/ML IJ SOLN
20.0000 mg | Freq: Once | INTRAMUSCULAR | Status: AC
  Administered 2018-01-18: 20 mg via INTRAVENOUS
  Filled 2018-01-18: qty 2

## 2018-01-18 MED ORDER — TRAMADOL HCL 50 MG PO TABS
50.0000 mg | ORAL_TABLET | Freq: Two times a day (BID) | ORAL | Status: DC
  Administered 2018-01-18 – 2018-01-22 (×9): 50 mg via ORAL
  Filled 2018-01-18 (×9): qty 1

## 2018-01-18 MED ORDER — VERAPAMIL HCL ER 240 MG PO TBCR
240.0000 mg | EXTENDED_RELEASE_TABLET | Freq: Every day | ORAL | Status: DC
  Administered 2018-01-18 – 2018-01-22 (×5): 240 mg via ORAL
  Filled 2018-01-18 (×5): qty 1

## 2018-01-18 MED ORDER — METOPROLOL TARTRATE 12.5 MG HALF TABLET
12.5000 mg | ORAL_TABLET | Freq: Two times a day (BID) | ORAL | Status: DC
  Administered 2018-01-18 – 2018-01-22 (×10): 12.5 mg via ORAL
  Filled 2018-01-18 (×10): qty 1

## 2018-01-18 NOTE — ED Notes (Signed)
HH tray ordered 

## 2018-01-18 NOTE — Progress Notes (Signed)
PROGRESS NOTE    Gabriella Roth  ZOX:[id number] DOB: 02-Dec-1960 DOA: 01/17/2018 PCP: Lesia Hausen, PA   Brief Narrative:   57 year old female with morbid obesity, essential hypertension, vestibular schwannoma status post radiation, hypothyroidism came to the ER with complains of right shoulder pain and syncope about a week ago.  During her syncope she ended up hurting her right shoulder but did not have any warning signs prior to her syncope.  Since then she has been complaining of pain.  In the ER she was noted to have 88 percent saturation on room air.  For CT of the chest when she was laid down she felt extremely short of breath therefore she was admitted to the hospital.  CT of the chest was negative but showed concerns of possible pulmonary hypertension and cardiomegaly.  Assessment & Plan:   Principal Problem:   Acute respiratory failure with hypoxia (HCC) Active Problems:   HTN (hypertension)   Right shoulder pain  Acute respiratory distress with hypoxia Obesity hypoventilation syndrome and/or obstructive sleep apnea Medically noncompliant - Patient has been noncompliant with her CPAP.  Given her body habitus a suspect she has obstructive sleep apnea and/or obesity hypoventilation syndrome -I would recommend outpatient sleep study.  CT of the chest is negative for pulmonary embolism but shows concerns of pulmonary artery hypertension -We will check echocardiogram which is pending at this time -Received IV Lasix in the ER, she put out about 1.3 L of urine - Will order bedtime CPAP machine here  Essential hypertension -Resume her home verapamil 240 mg daily, metoprolol 12.5 mg twice daily  Hypothyroidism -Continue Synthroid 50 mcg -Check TSH  GERD -Continue Pepcid 20 mg twice daily  Right shoulder pain -Musculoskeletal in nature, pain control   DVT prophylaxis: Lovenox Code Status: Full code Family Communication: None at bedside Disposition Plan: Maintain inpatient  stay for further evaluation  Consultants:   None  Procedures:   None  Antimicrobials:   None   Subjective: Patient reports to me that she does have exertional dyspnea and also when she lays flat.  Denies any lower extremity swelling, chest pain but she admits of intermittent right shoulder pain especially in certain position since her fall last week.  Review of Systems Otherwise negative except as per HPI, including: General: Denies fever, chills, night sweats or unintended weight loss. Resp: Denies cough, wheezing, Cardiac: Denies chest pain, palpitations, orthopnea, paroxysmal nocturnal dyspnea. GI: Denies abdominal pain, nausea, vomiting, diarrhea or constipation GU: Denies dysuria, frequency, hesitancy or incontinence MS: Right shoulder pain Neuro: Denies headache, neurologic deficits (focal weakness, numbness, tingling), abnormal gait Psych: Denies anxiety, depression, SI/HI/AVH Skin: Denies new rashes or lesions ID: Denies sick contacts, exotic exposures, travel  Objective: Vitals:   01/18/18 1045 01/18/18 1100 01/18/18 1156 01/18/18 1156  BP: (!) 146/97 (!) 134/94  (!) 120/91  Pulse: 81 80  83  Resp: 18 18  16   Temp:    97.7 F (36.5 C)  TempSrc:   Oral Oral  SpO2: 97% 96%  95%  Weight:   (!) 140.8 kg (310 lb 6.5 oz)   Height:   5\' 5"  (1.651 m)     Intake/Output Summary (Last 24 hours) at 01/18/2018 1247 Last data filed at 01/18/2018 1115 Gross per 24 hour  Intake -  Output 1300 ml  Net -1300 ml   Filed Weights   01/18/18 1156  Weight: (!) 140.8 kg (310 lb 6.5 oz)    Examination:  General exam: Appears calm and comfortable, morbid  obesity Respiratory system: Diffuse diminished breath sounds Cardiovascular system: S1 & S2 heard, RRR. No JVD, murmurs, rubs, gallops or clicks. No pedal edema. Gastrointestinal system: Abdomen is nondistended, soft and nontender. No organomegaly or masses felt. Normal bowel sounds heard. Central nervous system: Alert and  oriented. No focal neurological deficits. Extremities: Symmetric 5 x 5 power.  Limited range of motion of her right shoulder Skin: No rashes, lesions or ulcers Psychiatry: Judgement and insight appear normal. Mood & affect appropriate.     Data Reviewed:   CBC: Recent Labs  Lab 01/17/18 1609  WBC 8.3  HGB 14.7  HCT 48.5*  MCV 89.5  PLT 245   Basic Metabolic Panel: Recent Labs  Lab 01/17/18 1609 01/18/18 0144  NA 140 141  K 3.8 3.9  CL 105 106  CO2 25 23  GLUCOSE 102* 99  BUN 12 12  CREATININE 1.07* 1.05*  CALCIUM 9.1 9.2   GFR: Estimated Creatinine Clearance: 87.5 mL/min (A) (by C-G formula based on SCr of 1.05 mg/dL (H)). Liver Function Tests: No results for input(s): AST, ALT, ALKPHOS, BILITOT, PROT, ALBUMIN in the last 168 hours. No results for input(s): LIPASE, AMYLASE in the last 168 hours. No results for input(s): AMMONIA in the last 168 hours. Coagulation Profile: No results for input(s): INR, PROTIME in the last 168 hours. Cardiac Enzymes: Recent Labs  Lab 01/18/18 0144  TROPONINI <0.03   BNP (last 3 results) No results for input(s): PROBNP in the last 8760 hours. HbA1C: No results for input(s): HGBA1C in the last 72 hours. CBG: No results for input(s): GLUCAP in the last 168 hours. Lipid Profile: No results for input(s): CHOL, HDL, LDLCALC, TRIG, CHOLHDL, LDLDIRECT in the last 72 hours. Thyroid Function Tests: No results for input(s): TSH, T4TOTAL, FREET4, T3FREE, THYROIDAB in the last 72 hours. Anemia Panel: No results for input(s): VITAMINB12, FOLATE, FERRITIN, TIBC, IRON, RETICCTPCT in the last 72 hours. Sepsis Labs: No results for input(s): PROCALCITON, LATICACIDVEN in the last 168 hours.  No results found for this or any previous visit (from the past 240 hour(s)).       Radiology Studies: Dg Chest 2 View  Result Date: 01/17/2018 CLINICAL DATA:  Syncopal episode today.  Shortness of breath. EXAM: CHEST - 2 VIEW COMPARISON:   10/03/2016 FINDINGS: The heart size and mediastinal contours are within normal limits. Both lungs are clear. The visualized skeletal structures are unremarkable. IMPRESSION: Stable exam.  No active cardiopulmonary disease. Electronically Signed   By: Earle Gell M.D.   On: 01/17/2018 17:13   Dg Shoulder Right  Result Date: 01/17/2018 CLINICAL DATA:  Fall in parking lot. Right shoulder pain. Initial encounter. EXAM: RIGHT SHOULDER - 2+ VIEW COMPARISON:  None. FINDINGS: There is no evidence of fracture or dislocation. There is no evidence of arthropathy or other focal bone abnormality. Soft tissues are unremarkable. IMPRESSION: Negative. Electronically Signed   By: Earle Gell M.D.   On: 01/17/2018 17:14   Ct Angio Chest Pe W/cm &/or Wo Cm  Result Date: 01/17/2018 CLINICAL DATA:  57 y/o  F; hypoxia. EXAM: CT ANGIOGRAPHY CHEST WITH CONTRAST TECHNIQUE: Multidetector CT imaging of the chest was performed using the standard protocol during bolus administration of intravenous contrast. Multiplanar CT image reconstructions and MIPs were obtained to evaluate the vascular anatomy. CONTRAST:  117mL ISOVUE-370 IOPAMIDOL (ISOVUE-370) INJECTION 76% COMPARISON:  01/17/2018 chest radiograph FINDINGS: Cardiovascular: Mild cardiomegaly. Normal caliber thoracic aorta with mild atherosclerosis. Enlarged main pulmonary artery measuring up to 3.7 cm. No pericardial  effusion. Satisfactory opacification of the pulmonary arteries. No pulmonary embolus identified. Mediastinum/Nodes: No enlarged mediastinal, hilar, or axillary lymph nodes. Thyroid gland, trachea, and esophagus demonstrate no significant findings. Lungs/Pleura: Mosaic attenuation of the lung parenchyma. Platelike opacity at the right lung base. No pleural effusion or pneumothorax. Upper Abdomen: Partially visualized gastric bypass postsurgical changes. Cholecystectomy. Nonspecific densities in the right lobe of liver, probably small granuloma. Musculoskeletal: No chest  wall abnormality. No acute or significant osseous findings. Review of the MIP images confirms the above findings. IMPRESSION: 1. No pulmonary embolus identified. 2. Mosaic attenuation of lung parenchyma may represent small vessel or small airways disease. 3. Platelike opacity at the right lung base, probably atelectasis. 4. Mild cardiomegaly and enlarged main pulmonary artery which may represent pulmonary artery hypertension. Electronically Signed   By: Kristine Garbe M.D.   On: 01/17/2018 22:59        Scheduled Meds: . citalopram  40 mg Oral Daily  . darifenacin  15 mg Oral Daily  . dicyclomine  20 mg Oral TID AC  . enoxaparin (LOVENOX) injection  40 mg Subcutaneous Q24H  . famotidine  20 mg Oral BID  . hydrOXYzine  25 mg Oral BID  . levothyroxine  50 mcg Oral QAC breakfast  . metoprolol tartrate  12.5 mg Oral BID  . potassium chloride SA  20 mEq Oral Daily  . traMADol  50 mg Oral Q12H  . verapamil  240 mg Oral Daily   Continuous Infusions:   LOS: 0 days    Time spent: 32 mins    Ankit Arsenio Loader, MD [facility] Pager [phone]   If 7PM-7AM, please contact night-coverage [email or web address] Password [facility] 01/18/2018, 12:47 PM

## 2018-01-18 NOTE — ED Notes (Signed)
Pt placed on tele for transport.  Pt advised she had room upstair and was waiting to call report. Suction canister changed out on wall.

## 2018-01-18 NOTE — H&P (Signed)
History and Physical    Gabriella Roth TKW:[id number] DOB: 1961/04/16 DOA: 01/17/2018  PCP: Lesia Hausen, PA  Patient coming from: ALF  I have personally briefly reviewed patient's old medical records in [facility]  Chief Complaint: R shoulder pain  HPI: Gabriella Gassen is a 57 y.o. female with medical history significant of morbid obesity, HTN, hypothyroidism, vestibular schwanoma s/p radiation.  Patient presents to the ED with R shoulder pain ongoing since an episode of fall and syncope x1 week ago.  Pain is worse with palpation, seems musculoskeletal; however, EDP is concerned because patients O2 sat is 88% on RA.  She doesn't wear oxygen at baseline.  Patient reports that yesterday she was seen by her urologist for a kidney stone evaluation, she reports that she has been feeling short of breath for the past few weeks and that yesterday, when they laid her down for the scan, she became very short of breath and had to sit up.  She denies coughing more than usual recently.   ED Course: CT chest neg for PE, does show poss pulm HTN and cardiomegally, poss lung small vessel or small airway disease.   Review of Systems: As per HPI otherwise 10 point review of systems negative.   Past Medical History:  Diagnosis Date  . 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 Coronary artery disease   . History of radiation therapy 08/01/2013   EBRT: Completed 45 Gy out of a planned 54 Gy, completed 08/01/13 [facility]  . Hypertension   . Hypothyroidism   . Obesity     Past Surgical History:  Procedure Laterality Date  . Arm Surgery    . CHOLECYSTECTOMY    . GASTRIC BYPASS    . TONSILLECTOMY       reports that she has never smoked. She has never used smokeless tobacco. She reports that she does not drink alcohol or use drugs.  Allergies  Allergen Reactions  . Avapro [Irbesartan]     Unknown, listed on MAR  . Codeine     Unknown, listed on MAR  . Ibuprofen Nausea Only  . Prilosec [Omeprazole]     Unknown, listed on MAR  . Vasotec [Enalapril]     Unknown, listed on MAR  . Vistaril [Hydroxyzine Hcl]     "jittery"  . Zyrtec [Cetirizine] Hives  . Bactrim [Sulfamethoxazole-Trimethoprim] Rash  . Clindamycin/Lincomycin     Heartburn  . Keflex [Cephalexin] Rash    History reviewed. No pertinent family history. Husband did used to need an oxygen concentrator, now deceased.  Prior to Admission medications   Medication Sig Start Date End Date Taking? Authorizing Provider  alum & mag hydroxide-simeth (MAALOX/MYLANTA) 200-200-20 MG/5ML suspension Take 30 mLs by mouth 2 (two) times daily as needed for indigestion or heartburn.    [provider]  benzonatate (TESSALON) 100 MG capsule Take 1 capsule (100 mg total) by mouth 3 (three) times daily as needed for cough. 09/28/15   Everlene Balls, MD  bisacodyl (DULCOLAX) 10 MG suppository Place 10 mg rectally as needed for mild constipation or moderate constipation (*self administered*).    [provider]  citalopram (CELEXA) 40 MG tablet Take 40 mg by mouth daily.    [provider]  clotrimazole (LOTRIMIN) 1 % cream Apply to affected area 2 times daily.   Only use for one week.  Do not use inside vagina 10/03/16   Milton Ferguson, MD  guaifenesin (ROBITUSSIN) 100 MG/5ML syrup Take 200 mg by mouth 4 (  four) times daily as needed for cough.    [provider]  HYDROcodone-acetaminophen (NORCO) 5-325 MG tablet Take 1 tablet by mouth every 6 (six) hours as needed. 08/31/17   Doristine Devoid, PA-C  levothyroxine (SYNTHROID, LEVOTHROID) 50 MCG tablet Take 50 mcg by mouth daily before breakfast.    [provider]  lidocaine (LIDODERM) 5 % Place 1 patch onto the skin daily. Remove & Discard patch within 12 hours or as directed by MD 08/31/17   Ocie Cornfield T, PA-C  lisinopril (PRINIVIL,ZESTRIL) 5 MG tablet Take 5 mg by mouth daily.    [provider]  magnesium hydroxide (MILK OF MAGNESIA) 400  MG/5ML suspension Take 15 mLs by mouth daily as needed for mild constipation.    [provider]  meclizine (ANTIVERT) 25 MG tablet Take 25 mg by mouth daily.    [provider]  metoprolol (LOPRESSOR) 50 MG tablet Take 50 mg by mouth 2 (two) times daily.    [provider]  Multiple Vitamin (MULTIVITAMIN WITH MINERALS) TABS tablet Take 1 tablet by mouth daily.    [provider]  nystatin (MYCOSTATIN/NYSTOP) 100000 UNIT/GM POWD Apply 1-2 g topically daily. For 3 weeks then leave at bedside as needed.    [provider]  Olopatadine HCl (PATADAY) 0.2 % SOLN Apply 1 drop to eye daily.    [provider]  potassium chloride SA (K-DUR,KLOR-CON) 20 MEQ tablet Take 20 mEq by mouth daily.    [provider]  ranitidine (ZANTAC) 150 MG tablet Take 150 mg by mouth daily.    [provider]  solifenacin (VESICARE) 10 MG tablet Take 10 mg by mouth daily.    [provider]  traMADol (ULTRAM) 50 MG tablet Take 25 mg by mouth every 12 (twelve) hours as needed for moderate pain.    [provider]  triamterene-hydrochlorothiazide (MAXZIDE-25) 37.5-25 MG per tablet Take 1 tablet by mouth daily.    [provider]  verapamil (VERELAN PM) 240 MG 24 hr capsule Take 240 mg by mouth daily.    [provider]    Physical Exam: Vitals:   01/17/18 2245 01/17/18 2315 01/17/18 2345 01/18/18 0030  BP:  134/88 126/70   Pulse: 84 94  77  Resp: (!) 21 20  (!) 21  Temp:      SpO2: 95% 94%  98%    Constitutional: NAD, calm, comfortable Eyes: PERRL, lids and conjunctivae normal ENMT: Mucous membranes are moist. Posterior pharynx clear of any exudate or lesions.Normal dentition.  Neck: normal, supple, no masses, no thyromegaly Respiratory: clear to auscultation bilaterally, no wheezing, no crackles. Normal respiratory effort. No accessory muscle use.  Cardiovascular: Regular rate and rhythm, no murmurs / rubs /  gallops. No extremity edema. 2+ pedal pulses. No carotid bruits.  Abdomen: no tenderness, no masses palpated. No hepatosplenomegaly. Bowel sounds positive.  Musculoskeletal: no clubbing / cyanosis. No joint deformity upper and lower extremities. Good ROM, no contractures. Normal muscle tone.  Skin: no rashes, lesions, ulcers. No induration Neurologic: CN 2-12 grossly intact. Sensation intact, DTR normal. Strength 5/5 in all 4.  Psychiatric: Normal judgment and insight. Alert and oriented x 3. Normal mood.    Labs on Admission: I have personally reviewed following labs and imaging studies  CBC: Recent Labs  Lab 01/17/18 1609  WBC 8.3  HGB 14.7  HCT 48.5*  MCV 89.5  PLT 638   Basic Metabolic Panel: Recent Labs  Lab 01/17/18 1609  NA 140  K 3.8  CL 105  CO2 25  GLUCOSE 102*  BUN 12  CREATININE 1.07*  CALCIUM 9.1   GFR: CrCl cannot be calculated (Unknown ideal weight.). Liver Function Tests: No results for input(s): AST, ALT, ALKPHOS, BILITOT, PROT, ALBUMIN in the last 168 hours. No results for input(s): LIPASE, AMYLASE in the last 168 hours. No results for input(s): AMMONIA in the last 168 hours. Coagulation Profile: No results for input(s): INR, PROTIME in the last 168 hours. Cardiac Enzymes: No results for input(s): CKTOTAL, CKMB, CKMBINDEX, TROPONINI in the last 168 hours. BNP (last 3 results) No results for input(s): PROBNP in the last 8760 hours. HbA1C: No results for input(s): HGBA1C in the last 72 hours. CBG: No results for input(s): GLUCAP in the last 168 hours. Lipid Profile: No results for input(s): CHOL, HDL, LDLCALC, TRIG, CHOLHDL, LDLDIRECT in the last 72 hours. Thyroid Function Tests: No results for input(s): TSH, T4TOTAL, FREET4, T3FREE, THYROIDAB in the last 72 hours. Anemia Panel: No results for input(s): VITAMINB12, FOLATE, FERRITIN, TIBC, IRON, RETICCTPCT in the last 72 hours. Urine analysis:    Component Value Date/Time   COLORURINE YELLOW  04/30/2015 2132   APPEARANCEUR CLEAR 04/30/2015 2132   LABSPEC 1.020 04/30/2015 2132   PHURINE 7.5 04/30/2015 2132   GLUCOSEU NEGATIVE 04/30/2015 2132   HGBUR NEGATIVE 04/30/2015 2132   BILIRUBINUR NEGATIVE 04/30/2015 2132   KETONESUR NEGATIVE 04/30/2015 2132   PROTEINUR NEGATIVE 04/30/2015 2132   UROBILINOGEN 1.0 04/30/2015 2132   NITRITE NEGATIVE 04/30/2015 2132   LEUKOCYTESUR LARGE (A) 04/30/2015 2132    Radiological Exams on Admission: Dg Chest 2 View  Result Date: 01/17/2018 CLINICAL DATA:  Syncopal episode today.  Shortness of breath. EXAM: CHEST - 2 VIEW COMPARISON:  10/03/2016 FINDINGS: The heart size and mediastinal contours are within normal limits. Both lungs are clear. The visualized skeletal structures are unremarkable. IMPRESSION: Stable exam.  No active cardiopulmonary disease. Electronically Signed   By: Earle Gell M.D.   On: 01/17/2018 17:13   Dg Shoulder Right  Result Date: 01/17/2018 CLINICAL DATA:  Fall in parking lot. Right shoulder pain. Initial encounter. EXAM: RIGHT SHOULDER - 2+ VIEW COMPARISON:  None. FINDINGS: There is no evidence of fracture or dislocation. There is no evidence of arthropathy or other focal bone abnormality. Soft tissues are unremarkable. IMPRESSION: Negative. Electronically Signed   By: Earle Gell M.D.   On: 01/17/2018 17:14   Ct Angio Chest Pe W/cm &/or Wo Cm  Result Date: 01/17/2018 CLINICAL DATA:  57 y/o  F; hypoxia. EXAM: CT ANGIOGRAPHY CHEST WITH CONTRAST TECHNIQUE: Multidetector CT imaging of the chest was performed using the standard protocol during bolus administration of intravenous contrast. Multiplanar CT image reconstructions and MIPs were obtained to evaluate the vascular anatomy. CONTRAST:  121mL ISOVUE-370 IOPAMIDOL (ISOVUE-370) INJECTION 76% COMPARISON:  01/17/2018 chest radiograph FINDINGS: Cardiovascular: Mild cardiomegaly. Normal caliber thoracic aorta with mild atherosclerosis. Enlarged main pulmonary artery measuring up to  3.7 cm. No pericardial effusion. Satisfactory opacification of the pulmonary arteries. No pulmonary embolus identified. Mediastinum/Nodes: No enlarged mediastinal, hilar, or axillary lymph nodes. Thyroid gland, trachea, and esophagus demonstrate no significant findings. Lungs/Pleura: Mosaic attenuation of the lung parenchyma. Platelike opacity at the right lung base. No pleural effusion or pneumothorax. Upper Abdomen: Partially visualized gastric bypass postsurgical changes. Cholecystectomy. Nonspecific densities in the right lobe of liver, probably small granuloma. Musculoskeletal: No chest wall abnormality. No acute or significant osseous findings. Review of the MIP images confirms the above findings. IMPRESSION: 1. No pulmonary  embolus identified. 2. Mosaic attenuation of lung parenchyma may represent small vessel or small airways disease. 3. Platelike opacity at the right lung base, probably atelectasis. 4. Mild cardiomegaly and enlarged main pulmonary artery which may represent pulmonary artery hypertension. Electronically Signed   By: Kristine Garbe M.D.   On: 01/17/2018 22:59    EKG: Independently reviewed.  Assessment/Plan Principal Problem:   Acute respiratory failure with hypoxia (HCC) Active Problems:   HTN (hypertension)   Right shoulder pain    1. Respiratory failure with hypoxia - sounds more subacute than true acute 1. DDx includes CHF, PAH, lung, HVOS 2. Given BNP elevation will try 20 of lasix IV x1 to see if this helps 3. 2d echo ordered 4. Tele monitor 5. Cont pulse ox 6. PFTs if above work up un-revealing 7. Strongly suspect patient needs sleep study for likely OSA 2. HTN - continue home BP meds 3. R shoulder pain - 1. Musculoskeletal from fall 2. Will get serial trops just in case but doubt ACS given HPI.  DVT prophylaxis: Lovenox Code Status: Full Family Communication: No family in room Disposition Plan: ALF after admit Consults called: None Admission  status: Admit to inpatient   Etta Quill DO [facility] Pager [phone]  If 7AM-7PM, please contact day team taking care of patient [email or web address] Password [email or web address]  01/18/2018, 1:26 AM

## 2018-01-19 ENCOUNTER — Inpatient Hospital Stay (HOSPITAL_COMMUNITY): Payer: Medicare HMO

## 2018-01-19 DIAGNOSIS — I361 Nonrheumatic tricuspid (valve) insufficiency: Secondary | ICD-10-CM

## 2018-01-19 LAB — ECHOCARDIOGRAM COMPLETE
Height: 65 in
WEIGHTICAEL: 4966.5229 [oz_av]

## 2018-01-19 LAB — BASIC METABOLIC PANEL
ANION GAP: 10 (ref 5–15)
BUN: 14 mg/dL (ref 6–20)
CO2: 28 mmol/L (ref 22–32)
Calcium: 9.3 mg/dL (ref 8.9–10.3)
Chloride: 106 mmol/L (ref 101–111)
Creatinine, Ser: 1.3 mg/dL — ABNORMAL HIGH (ref 0.44–1.00)
GFR calc Af Amer: 53 mL/min — ABNORMAL LOW (ref >60)
GFR, EST NON AFRICAN AMERICAN: 46 mL/min — AB (ref >60)
GLUCOSE: 91 mg/dL (ref 65–99)
POTASSIUM: 4.2 mmol/L (ref 3.5–5.1)
SODIUM: 144 mmol/L (ref 135–145)

## 2018-01-19 LAB — TSH: TSH: 3.537 u[IU]/mL (ref 0.350–4.500)

## 2018-01-19 MED ORDER — FUROSEMIDE 10 MG/ML IJ SOLN
80.0000 mg | Freq: Four times a day (QID) | INTRAMUSCULAR | Status: AC
  Administered 2018-01-19 (×2): 80 mg via INTRAVENOUS
  Filled 2018-01-19 (×3): qty 8

## 2018-01-19 MED ORDER — PERFLUTREN LIPID MICROSPHERE
1.0000 mL | INTRAVENOUS | Status: AC | PRN
Start: 2018-01-19 — End: 2018-01-19
  Administered 2018-01-19: 2 mL via INTRAVENOUS
  Filled 2018-01-19: qty 10

## 2018-01-19 NOTE — Progress Notes (Signed)
  Echocardiogram 2D Echocardiogram has been performed.  Jennette Dubin 01/19/2018, 3:25 PM

## 2018-01-19 NOTE — Plan of Care (Signed)
°  Problem: Safety: °Goal: Ability to remain free from injury will improve °Outcome: Progressing °  °Problem: Pain Managment: °Goal: General experience of comfort will improve °Outcome: Not Progressing °  °

## 2018-01-19 NOTE — Progress Notes (Signed)
PROGRESS NOTE    Gabriella Roth  XIP:[id number] DOB: January 12, 1961 DOA: 01/17/2018 PCP: Lesia Hausen, PA   Brief Narrative:   57 year old female with morbid obesity, essential hypertension, vestibular schwannoma status post radiation, hypothyroidism came to the ER with complains of right shoulder pain and syncope about a week ago.  During her syncope she ended up hurting her right shoulder but did not have any warning signs prior to her syncope.  Since then she has been complaining of pain.  In the ER she was noted to have 88 percent saturation on room air.  For CT of the chest when she was laid down she felt extremely short of breath therefore she was admitted to the hospital.  CT of the chest was negative but showed concerns of possible pulmonary hypertension and cardiomegaly.  Assessment & Plan:   Principal Problem:   Acute respiratory failure with hypoxia (HCC) Active Problems:   HTN (hypertension)   Right shoulder pain  Acute respiratory distress with hypoxia; improving Obesity hypoventilation syndrome and/or obstructive sleep apnea Medically noncompliant - Patient has been noncompliant with her CPAP.  Given her body habitus a suspect she has obstructive sleep apnea and/or obesity hypoventilation syndrome -I would recommend outpatient sleep study.  CT of the chest is negative for pulmonary embolism but shows concerns of pulmonary artery hypertension -Echocardiogram is still pending -Received IV Lasix in the ER, she put out about 1.3 L of urine. Will give her 2 more doses of Lasix today.  -  bedtime CPAP machine  Essential hypertension -on home verapamil 240 mg daily, metoprolol 12.5 mg twice daily  Hypothyroidism -Continue Synthroid 50 mcg -TSH - Normal 3.5  GERD -Continue Pepcid 20 mg twice daily  Right shoulder pain -Musculoskeletal in nature, pain control   DVT prophylaxis: Lovenox Code Status: Full code Family Communication: None at bedside Disposition Plan: Maintain  inpatient stay today for some diuresis  Consultants:   None  Procedures:   None  Antimicrobials:   None   Subjective: States feels a little better. Has lots of dietary questions, I have answered the basic questions for her and offered her Dietician consult.   Review of Systems Otherwise negative except as per HPI, including: General: Denies fever, chills, night sweats or unintended weight loss. Resp: Denies cough, wheezing, Cardiac: Denies chest pain, palpitations GI: Denies abdominal pain, nausea, vomiting, diarrhea or constipation GU: Denies dysuria, frequency, hesitancy or incontinence MS: Right shoulder pain Neuro: Denies headache, neurologic deficits (focal weakness, numbness, tingling), abnormal gait Psych: Denies anxiety, depression, SI/HI/AVH Skin: Denies new rashes or lesions ID: Denies sick contacts, exotic exposures, travel  Objective: Vitals:   01/18/18 1156 01/18/18 1751 01/18/18 2216 01/19/18 0736  BP: (!) 120/91 104/82 110/62 (!) 127/104  Pulse: 83 73 64 78  Resp: 16 16 18 20   Temp: 97.7 F (36.5 C) 97.9 F (36.6 C) 98 F (36.7 C)   TempSrc: Oral Oral Oral   SpO2: 95% (!) 89% 97% 92%  Weight:      Height:        Intake/Output Summary (Last 24 hours) at 01/19/2018 1117 Last data filed at 01/18/2018 2230 Gross per 24 hour  Intake 720 ml  Output 300 ml  Net 420 ml   Filed Weights   01/18/18 1156  Weight: (!) 140.8 kg (310 lb 6.5 oz)    Examination:  General exam: Appears calm and comfortable, morbid obesity Respiratory system: Diffuse diminished breath sounds- persist.  Cardiovascular system: S1 & S2 heard, RRR. No JVD,  murmurs, rubs, gallops or clicks. No pedal edema. Gastrointestinal system: Abdomen is nondistended, soft and nontender. No organomegaly or masses felt. Normal bowel sounds heard. Central nervous system: Alert and oriented. No focal neurological deficits. Extremities: Symmetric 5 x 5 power.  Limited range of motion of her  right shoulder Skin: No rashes, lesions or ulcers Psychiatry: Judgement and insight appear normal. Mood & affect appropriate.     Data Reviewed:   CBC: Recent Labs  Lab 01/17/18 1609  WBC 8.3  HGB 14.7  HCT 48.5*  MCV 89.5  PLT 182   Basic Metabolic Panel: Recent Labs  Lab 01/17/18 1609 01/18/18 0144  NA 140 141  K 3.8 3.9  CL 105 106  CO2 25 23  GLUCOSE 102* 99  BUN 12 12  CREATININE 1.07* 1.05*  CALCIUM 9.1 9.2   GFR: Estimated Creatinine Clearance: 87.5 mL/min (A) (by C-G formula based on SCr of 1.05 mg/dL (H)). Liver Function Tests: No results for input(s): AST, ALT, ALKPHOS, BILITOT, PROT, ALBUMIN in the last 168 hours. No results for input(s): LIPASE, AMYLASE in the last 168 hours. No results for input(s): AMMONIA in the last 168 hours. Coagulation Profile: No results for input(s): INR, PROTIME in the last 168 hours. Cardiac Enzymes: Recent Labs  Lab 01/18/18 0144 01/18/18 1154 01/18/18 1718  TROPONINI <0.03 <0.03 <0.03   BNP (last 3 results) No results for input(s): PROBNP in the last 8760 hours. HbA1C: No results for input(s): HGBA1C in the last 72 hours. CBG: No results for input(s): GLUCAP in the last 168 hours. Lipid Profile: No results for input(s): CHOL, HDL, LDLCALC, TRIG, CHOLHDL, LDLDIRECT in the last 72 hours. Thyroid Function Tests: Recent Labs    01/19/18 0934  TSH 3.537   Anemia Panel: No results for input(s): VITAMINB12, FOLATE, FERRITIN, TIBC, IRON, RETICCTPCT in the last 72 hours. Sepsis Labs: No results for input(s): PROCALCITON, LATICACIDVEN in the last 168 hours.  No results found for this or any previous visit (from the past 240 hour(s)).       Radiology Studies: Dg Chest 2 View  Result Date: 01/17/2018 CLINICAL DATA:  Syncopal episode today.  Shortness of breath. EXAM: CHEST - 2 VIEW COMPARISON:  10/03/2016 FINDINGS: The heart size and mediastinal contours are within normal limits. Both lungs are clear. The  visualized skeletal structures are unremarkable. IMPRESSION: Stable exam.  No active cardiopulmonary disease. Electronically Signed   By: Earle Gell M.D.   On: 01/17/2018 17:13   Dg Shoulder Right  Result Date: 01/17/2018 CLINICAL DATA:  Fall in parking lot. Right shoulder pain. Initial encounter. EXAM: RIGHT SHOULDER - 2+ VIEW COMPARISON:  None. FINDINGS: There is no evidence of fracture or dislocation. There is no evidence of arthropathy or other focal bone abnormality. Soft tissues are unremarkable. IMPRESSION: Negative. Electronically Signed   By: Earle Gell M.D.   On: 01/17/2018 17:14   Ct Angio Chest Pe W/cm &/or Wo Cm  Result Date: 01/17/2018 CLINICAL DATA:  57 y/o  F; hypoxia. EXAM: CT ANGIOGRAPHY CHEST WITH CONTRAST TECHNIQUE: Multidetector CT imaging of the chest was performed using the standard protocol during bolus administration of intravenous contrast. Multiplanar CT image reconstructions and MIPs were obtained to evaluate the vascular anatomy. CONTRAST:  168mL ISOVUE-370 IOPAMIDOL (ISOVUE-370) INJECTION 76% COMPARISON:  01/17/2018 chest radiograph FINDINGS: Cardiovascular: Mild cardiomegaly. Normal caliber thoracic aorta with mild atherosclerosis. Enlarged main pulmonary artery measuring up to 3.7 cm. No pericardial effusion. Satisfactory opacification of the pulmonary arteries. No pulmonary embolus identified. Mediastinum/Nodes:  No enlarged mediastinal, hilar, or axillary lymph nodes. Thyroid gland, trachea, and esophagus demonstrate no significant findings. Lungs/Pleura: Mosaic attenuation of the lung parenchyma. Platelike opacity at the right lung base. No pleural effusion or pneumothorax. Upper Abdomen: Partially visualized gastric bypass postsurgical changes. Cholecystectomy. Nonspecific densities in the right lobe of liver, probably small granuloma. Musculoskeletal: No chest wall abnormality. No acute or significant osseous findings. Review of the MIP images confirms the above  findings. IMPRESSION: 1. No pulmonary embolus identified. 2. Mosaic attenuation of lung parenchyma may represent small vessel or small airways disease. 3. Platelike opacity at the right lung base, probably atelectasis. 4. Mild cardiomegaly and enlarged main pulmonary artery which may represent pulmonary artery hypertension. Electronically Signed   By: Kristine Garbe M.D.   On: 01/17/2018 22:59        Scheduled Meds: . citalopram  40 mg Oral Daily  . darifenacin  15 mg Oral Daily  . dicyclomine  20 mg Oral TID AC  . enoxaparin (LOVENOX) injection  40 mg Subcutaneous Q24H  . famotidine  20 mg Oral BID  . hydrOXYzine  25 mg Oral BID  . levothyroxine  50 mcg Oral QAC breakfast  . metoprolol tartrate  12.5 mg Oral BID  . potassium chloride SA  20 mEq Oral Daily  . traMADol  50 mg Oral Q12H  . verapamil  240 mg Oral Daily   Continuous Infusions:   LOS: 1 day    Time spent: 25 mins    Ankit Arsenio Loader, MD [facility] Pager [phone]   If 7PM-7AM, please contact night-coverage [email or web address] Password [email or web address] 01/19/2018, 11:17 AM

## 2018-01-19 NOTE — Progress Notes (Addendum)
Nutrition Consult/Brief Note  RD consulted for pt having several dietary questions/preferences.  Pt sleeping soundly upon RD visit. Able to wake. Pt stated the "Aide" answered her questions and then proceeded to go back to sleep.  Wt Readings from Last 15 Encounters:  01/18/18 (!) 310 lb 6.5 oz (140.8 kg)  10/03/16 (!) 305 lb (138.3 kg)  09/27/15 255 lb (115.7 kg)  07/09/15 255 lb (115.7 kg)   Body mass index is 51.65 kg/m. Patient meets criteria for Obesity Class III based on current BMI.   Current diet order is Heart Healthy. Labs and medications reviewed.   No nutrition interventions warranted at this time. If nutrition issues arise, please consult RD.   Arthur Holms, RD, LDN Pager #: [phone] After-Hours Pager #: [phone]

## 2018-01-20 LAB — BASIC METABOLIC PANEL
ANION GAP: 13 (ref 5–15)
BUN: 16 mg/dL (ref 6–20)
CALCIUM: 9.2 mg/dL (ref 8.9–10.3)
CO2: 27 mmol/L (ref 22–32)
Chloride: 100 mmol/L — ABNORMAL LOW (ref 101–111)
Creatinine, Ser: 1.17 mg/dL — ABNORMAL HIGH (ref 0.44–1.00)
GFR calc non Af Amer: 52 mL/min — ABNORMAL LOW (ref >60)
Glucose, Bld: 88 mg/dL (ref 65–99)
Potassium: 3.5 mmol/L (ref 3.5–5.1)
SODIUM: 140 mmol/L (ref 135–145)

## 2018-01-20 LAB — BLOOD GAS, ARTERIAL
ACID-BASE EXCESS: 5.3 mmol/L — AB (ref 0.0–2.0)
Bicarbonate: 29.3 mmol/L — ABNORMAL HIGH (ref 20.0–28.0)
Drawn by: 52160
O2 CONTENT: 3.5 L/min
O2 SAT: 93.6 %
PATIENT TEMPERATURE: 98.6
PO2 ART: 71 mmHg — AB (ref 83.0–108.0)
pCO2 arterial: 43.5 mmHg (ref 32.0–48.0)
pH, Arterial: 7.443 (ref 7.350–7.450)

## 2018-01-20 LAB — MAGNESIUM: MAGNESIUM: 1.9 mg/dL (ref 1.7–2.4)

## 2018-01-20 MED ORDER — POTASSIUM CHLORIDE CRYS ER 20 MEQ PO TBCR
40.0000 meq | EXTENDED_RELEASE_TABLET | Freq: Once | ORAL | Status: AC
Start: 2018-01-20 — End: 2018-01-20
  Administered 2018-01-20: 40 meq via ORAL
  Filled 2018-01-20: qty 2

## 2018-01-20 MED ORDER — FUROSEMIDE 10 MG/ML IJ SOLN
80.0000 mg | Freq: Four times a day (QID) | INTRAMUSCULAR | Status: AC
  Administered 2018-01-20 (×2): 80 mg via INTRAVENOUS
  Filled 2018-01-20 (×2): qty 8

## 2018-01-20 NOTE — Progress Notes (Signed)
PROGRESS NOTE    Gabriella Roth  UMP:[id number] DOB: 1961-07-09 DOA: 01/17/2018 PCP: Lesia Hausen, PA   Brief Narrative:   57 year old female with morbid obesity, essential hypertension, vestibular schwannoma status post radiation, hypothyroidism came to the ER with complains of right shoulder pain and syncope about a week ago.  During her syncope she ended up hurting her right shoulder but did not have any warning signs prior to her syncope.  Since then she has been complaining of pain.  In the ER she was noted to have 88 percent saturation on room air.  For CT of the chest when she was laid down she felt extremely short of breath therefore she was admitted to the hospital.  CT of the chest was negative but showed concerns of possible pulmonary hypertension and cardiomegaly.  Assessment & Plan:   Principal Problem:   Acute respiratory failure with hypoxia (HCC) Active Problems:   HTN (hypertension)   Right shoulder pain  Acute respiratory distress with hypoxia; improving Obesity hypoventilation syndrome and/or obstructive sleep apnea; Non Compliant Severe pulmonary arterial hypertension with moderate right ventricular systolic dysfunction - Patient has been noncompliant with her CPAP.  Given her body habitus a suspect she has obstructive sleep apnea and/or obesity hypoventilation syndrome -CTA chest is negative for pulmonary embolism but suggested elevated pulmonary arterial pressure - Echocardiogram showed ejection fraction 55% with grade 1 diastolic dysfunction, elevated PA pressure at 86 suggestive of severe pulmonary arterial hypertension with moderate right ventricular systolic dysfunction -Plan to give Lasix 80 mg IV twice today -  bedtime CPAP machine -I have discussed the case with Pulmonary (Dr Carson Myrtle and Dr Elsworth Soho) and Cardiology (Dr Lennie Muckle further inpatient workup indicated once her breathing is improved, she can follow-up outpatient at the heart failure clinic for possible  right heart catheterization and from there on can be referred to pulmonary clinic for further care. -We will check ABG for hypercarbia  Essential hypertension -on home verapamil 240 mg daily, metoprolol 12.5 mg twice daily  Hypothyroidism -Continue Synthroid 50 mcg -TSH - Normal 3.5  GERD -Continue Pepcid 20 mg twice daily  Right shoulder pain -Musculoskeletal in nature, pain control   DVT prophylaxis: Lovenox Code Status: Full code Family Communication: None at bedside Disposition Plan: Maintain inpatient stay today for diuresis with IV fluids  Consultants:   Curbside pulmonary and cardiology  Procedures:   None none  Antimicrobials:   None   Subjective: Feels a little better, no complaints overnight.  Review of Systems Otherwise negative except as per HPI, including: General: Denies fever, chills, night sweats or unintended weight loss. Resp: Denies cough, wheezing, Cardiac: Denies chest pain, palpitations GI: Denies abdominal pain, nausea, vomiting, diarrhea or constipation GU: Denies dysuria, frequency, hesitancy or incontinence MS: Right shoulder pain Neuro: Denies headache, neurologic deficits (focal weakness, numbness, tingling), abnormal gait Psych: Denies anxiety, depression, SI/HI/AVH Skin: Denies new rashes or lesions ID: Denies sick contacts, exotic exposures, travel  Objective: Vitals:   01/19/18 1558 01/19/18 2304 01/20/18 0742 01/20/18 1207  BP: 113/78 98/75 120/81 (!) 108/56  Pulse: 69 82 73 88  Resp: (!) 25 16 (!) 21 18  Temp: (!) 97.5 F (36.4 C) 97.9 F (36.6 C)  (!) 97.5 F (36.4 C)  TempSrc: Oral Oral  Oral  SpO2: (!) 89% 93% 96% 96%  Weight:      Height:        Intake/Output Summary (Last 24 hours) at 01/20/2018 1219 Last data filed at 01/20/2018 0000 Gross per 24 hour  Intake 144 ml  Output 2700 ml  Net -2556 ml   Filed Weights   01/18/18 1156  Weight: (!) 140.8 kg (310 lb 6.5 oz)    Examination:  General exam:  Appears calm and comfortable, morbidly obese Respiratory system: Diffuse diminished breath sounds Cardiovascular system: S1 & S2 heard, RRR. No JVD, murmurs, rubs, gallops or clicks. No pedal edema. Gastrointestinal system: Abdomen is nondistended, soft and nontender. No organomegaly or masses felt. Normal bowel sounds heard. Central nervous system: Alert and oriented. No focal neurological deficits. Extremities: Symmetric 5 x 5 power.  Limited range of motion of her right shoulder Skin: No rashes, lesions or ulcers Psychiatry: Judgement and insight appear normal. Mood & affect appropriate.     Data Reviewed:   CBC: Recent Labs  Lab 01/17/18 1609  WBC 8.3  HGB 14.7  HCT 48.5*  MCV 89.5  PLT 606   Basic Metabolic Panel: Recent Labs  Lab 01/17/18 1609 01/18/18 0144 01/19/18 1554 01/20/18 0300  NA 140 141 144 140  K 3.8 3.9 4.2 3.5  CL 105 106 106 100*  CO2 25 23 28 27   GLUCOSE 102* 99 91 88  BUN 12 12 14 16   CREATININE 1.07* 1.05* 1.30* 1.17*  CALCIUM 9.1 9.2 9.3 9.2  MG  --   --   --  1.9   GFR: Estimated Creatinine Clearance: 78.5 mL/min (A) (by C-G formula based on SCr of 1.17 mg/dL (H)). Liver Function Tests: No results for input(s): AST, ALT, ALKPHOS, BILITOT, PROT, ALBUMIN in the last 168 hours. No results for input(s): LIPASE, AMYLASE in the last 168 hours. No results for input(s): AMMONIA in the last 168 hours. Coagulation Profile: No results for input(s): INR, PROTIME in the last 168 hours. Cardiac Enzymes: Recent Labs  Lab 01/18/18 0144 01/18/18 1154 01/18/18 1718  TROPONINI <0.03 <0.03 <0.03   BNP (last 3 results) No results for input(s): PROBNP in the last 8760 hours. HbA1C: No results for input(s): HGBA1C in the last 72 hours. CBG: No results for input(s): GLUCAP in the last 168 hours. Lipid Profile: No results for input(s): CHOL, HDL, LDLCALC, TRIG, CHOLHDL, LDLDIRECT in the last 72 hours. Thyroid Function Tests: Recent Labs     01/19/18 0934  TSH 3.537   Anemia Panel: No results for input(s): VITAMINB12, FOLATE, FERRITIN, TIBC, IRON, RETICCTPCT in the last 72 hours. Sepsis Labs: No results for input(s): PROCALCITON, LATICACIDVEN in the last 168 hours.  No results found for this or any previous visit (from the past 240 hour(s)).       Radiology Studies: No results found.      Scheduled Meds: . citalopram  40 mg Oral Daily  . darifenacin  15 mg Oral Daily  . dicyclomine  20 mg Oral TID AC  . enoxaparin (LOVENOX) injection  40 mg Subcutaneous Q24H  . famotidine  20 mg Oral BID  . hydrOXYzine  25 mg Oral BID  . levothyroxine  50 mcg Oral QAC breakfast  . metoprolol tartrate  12.5 mg Oral BID  . potassium chloride SA  20 mEq Oral Daily  . traMADol  50 mg Oral Q12H  . verapamil  240 mg Oral Daily   Continuous Infusions:   LOS: 2 days    Time spent: 35 mins    Davionne Dowty Arsenio Loader, MD [facility] Pager [phone]   If 7PM-7AM, please contact night-coverage [email or web address] Password [employer] 01/20/2018, 12:19 PM

## 2018-01-20 NOTE — Plan of Care (Signed)
  Problem: Education: Goal: Knowledge of General Education information will improve Outcome: Progressing   Problem: Activity: Goal: Risk for activity intolerance will decrease Outcome: Not Progressing   Problem: Elimination: Goal: Will not experience complications related to bowel motility Outcome: Not Progressing   Patient did not tolerate CPAP mask, kept panicking and taking it off immediately.  Remained on 3L nasal cannula, oxygen saturations maintained > 90.

## 2018-01-20 NOTE — Evaluation (Signed)
Occupational Therapy Evaluation Patient Details Name: Gabriella Roth MRN: [id number] DOB: 1961/02/10 Today's Date: 01/20/2018    History of Present Illness Gabriella Vasco is a 57 y.o. female with medical history significant of morbid obesity, HTN, hypothyroidism, vestibular schwanoma s/p radiation.  Patient presented to the ED 4/17 with R shoulder pain ongoing since an episode of fall and syncope x1 week ago (scan of shoulder clear of fracture or sprain). CT chest neg for PE, does show poss pulm HTN and cardiomegally, poss lung small vessel or small airway disease.   Clinical Impression   PTA Pt living at ALF, but performing ADL mod I and mobilizing with bari rollator - gets out in the community using SCAT (she requires a lift to access bus). Pt is currently DOE even sitting in bed while performing grooming tasks and during sit <>stand transfers (min A). Pt with decreased activity tolerance. Pt will require skilled OT in the acute setting and afterwards at short term SNF to maximize safety and independence in ADL and functional transfers. The patient says that right now she feels like she needs someone to walk with her for safety and she wants to be able to walk down the hall alone with confidence. A short term SNF will allow her to return to PLOF. Next session to focus on AE for LB ADL and energy conservation.     Follow Up Recommendations  SNF;Supervision/Assistance - 24 hour    Equipment Recommendations  None recommended by OT    Recommendations for Other Services       Precautions / Restrictions Precautions Precautions: Fall Precaution Comments: watch O2 Restrictions Weight Bearing Restrictions: No      Mobility Bed Mobility Overal bed mobility: Modified Independent             General bed mobility comments: increased time and effort required  Transfers Overall transfer level: Needs assistance Equipment used: Rolling walker (2 wheeled) Transfers: Sit to/from Stand Sit  to Stand: Min assist         General transfer comment: min A to stabilize RW - will need bariatric for ambulation (uses Rollator at baseline)    Balance Overall balance assessment: Needs assistance Sitting-balance support: No upper extremity supported;Feet supported Sitting balance-Leahy Scale: Good     Standing balance support: Bilateral upper extremity supported Standing balance-Leahy Scale: Fair                             ADL either performed or assessed with clinical judgement   ADL Overall ADL's : Needs assistance/impaired Eating/Feeding: Modified independent   Grooming: Set up;Applying deodorant;Wash/dry face;Brushing hair;Bed level;Standing   Upper Body Bathing: Minimal assistance;Sitting Upper Body Bathing Details (indicate cue type and reason): no ROM deficits, able to reach around her back Lower Body Bathing: Minimal assistance;Bed level Lower Body Bathing Details (indicate cue type and reason): sponge bathing in bed Upper Body Dressing : Set up;Sitting Upper Body Dressing Details (indicate cue type and reason): to don hospital gown Lower Body Dressing: Moderate assistance;Sit to/from stand Lower Body Dressing Details (indicate cue type and reason): Pt requires BUE for pulling up items, able to don socks independently at bed level Toilet Transfer: Minimal assistance;RW Toilet Transfer Details (indicate cue type and reason): min A for sit <>Stand transfer stabilizing walker Toileting- Clothing Manipulation and Hygiene: Min guard;Sit to/from stand       Functional mobility during ADLs: Minimal assistance;Rolling walker(DOE) General ADL Comments: Pt DOE with grooming activities  seated in the bed and with transfer sit <>Stand     Vision Baseline Vision/History: Wears glasses Wears Glasses: At all times Patient Visual Report: No change from baseline Vision Assessment?: No apparent visual deficits     Perception     Praxis      Pertinent  Vitals/Pain Pain Assessment: No/denies pain     Hand Dominance Right   Extremity/Trunk Assessment Upper Extremity Assessment Upper Extremity Assessment: Overall WFL for tasks assessed;RUE deficits/detail RUE Deficits / Details: Pt states that shoulder is painful at random intermittent times - ROM functional for all ADL       Cervical / Trunk Assessment Cervical / Trunk Assessment: Other exceptions Cervical / Trunk Exceptions: large pannus   Communication Communication Communication: No difficulties   Cognition Arousal/Alertness: Awake/alert Behavior During Therapy: WFL for tasks assessed/performed Overall Cognitive Status: Within Functional Limits for tasks assessed                                 General Comments: Pt has brain tumor, 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, but able to follow directions and good reasoning/problem solving   General Comments  on room air from bathing when OT entered, O2 levels at 88%, returned to 95% on 2L O2 and remained stable    Exercises     Shoulder Instructions      Home Living Family/patient expects to be discharged to:: Assisted living                             Home Equipment: Walker - 4 wheels;Hand held shower head;Shower seat - built in;Grab bars - toilet;Grab bars - tub/shower          Prior Functioning/Environment Level of Independence: Independent with assistive device(s)        Comments: uses Rollator for ambulation, uses "spa bathroom" for showering 2 days a week; uses SCAT for transportation        OT Problem List: Decreased activity tolerance;Impaired balance (sitting and/or standing);Obesity      OT Treatment/Interventions: Therapeutic exercise;Energy conservation;DME and/or AE instruction;Therapeutic activities;Patient/family education;Balance training    OT Goals(Current goals can be found in the care plan section) Acute Rehab OT Goals Patient Stated Goal: to be able to walk down the hallway without  needing anyone OT Goal Formulation: With patient Time For Goal Achievement: 02/03/18 Potential to Achieve Goals: Good ADL Goals Pt Will Transfer to Toilet: with supervision;ambulating Pt Will Perform Toileting - Clothing Manipulation and hygiene: with modified independence;sit to/from stand Additional ADL Goal #1: Pt will verbalize how to use AE for LB ADL and demonstrate during session at mod I level Additional ADL Goal #2: Pt will recall 3 ways to conserve energy during ADL at Mod I level  OT Frequency: Min 2X/week   Barriers to D/C:            Co-evaluation              AM-PAC PT "6 Clicks" Daily Activity     Outcome Measure Help from another person eating meals?: None Help from another person taking care of personal grooming?: A Little Help from another person toileting, which includes using toliet, bedpan, or urinal?: A Little Help from another person bathing (including washing, rinsing, drying)?: A Little Help from another person to put on and taking off regular upper body clothing?: A Little Help from another person to put on and  taking off regular lower body clothing?: A Little 6 Click Score: 19   End of Session Equipment Utilized During Treatment: Rolling walker;Oxygen(2L) Nurse Communication: Mobility status  Activity Tolerance: Patient limited by fatigue;Other (comment)(DOE) Patient left: in bed;with call bell/phone within reach;with bed alarm set  OT Visit Diagnosis: Unsteadiness on feet (R26.81);Muscle weakness (generalized) (M62.81);History of falling (Z91.81)                Time: 1610-9604 OT Time Calculation (min): 41 min Charges:  OT General Charges $OT Visit: 1 Visit OT Evaluation $OT Eval Moderate Complexity: 1 Mod OT Treatments $Self Care/Home Management : 23-37 mins G-Codes:     Hulda Humphrey OTR/L [place] 01/20/2018, 1:59 PM

## 2018-01-20 NOTE — Evaluation (Signed)
Physical Therapy Evaluation Patient Details Name: Gabriella Roth MRN: [id number] DOB: 12-Nov-1960 Today's Date: 01/20/2018   History of Present Illness  Gabriella Roth is a 57 y.o. female with medical history significant of morbid obesity, HTN, hypothyroidism, vestibular schwanoma s/p radiation.  Patient presented to the ED 4/17 with R shoulder pain ongoing since an episode of fall and syncope x1 week ago (scan of shoulder clear of fracture or sprain). CT chest neg for PE, does show poss pulm HTN and cardiomegally, poss lung small vessel or small airway disease.    Clinical Impression  Pt presented supine in bed with HOB elevated, awake and willing to participate in therapy session. Prior to admission, pt was living in an ALF and ambulated with a rollator. Pt currently very limited secondary to fatigue and DOE. Pt on RA upon arrival with SPO2 decreasing to as low as 85% with activity. PT reapplied 2L of O2 via [place] with SPO2 increasing to mid 90's. Pt able to perform bed mobility with modified independence and transfers with min guard. Pt would continue to benefit from skilled physical therapy services at this time while admitted and after d/c to address the below listed limitations in order to improve overall safety and independence with functional mobility.     Follow Up Recommendations SNF    Equipment Recommendations  None recommended by PT    Recommendations for Other Services       Precautions / Restrictions Precautions Precautions: Fall Precaution Comments: watch O2 Restrictions Weight Bearing Restrictions: No      Mobility  Bed Mobility Overal bed mobility: Modified Independent                Transfers Overall transfer level: Needs assistance Equipment used: Rolling walker (2 wheeled) Transfers: Sit to/from [employer] Transfers Sit to Stand: Min guard Stand pivot transfers: Min guard       General transfer comment: min guard for safety; pt very limited  secondary to increased WOB and fatigue  Ambulation/Gait             General Gait Details: pt unable to tolerate at this time  Stairs            Wheelchair Mobility    Modified Rankin (Stroke Patients Only)       Balance Overall balance assessment: Needs assistance Sitting-balance support: No upper extremity supported;Feet supported Sitting balance-Leahy Scale: Good     Standing balance support: Bilateral upper extremity supported Standing balance-Leahy Scale: Poor                               Pertinent Vitals/Pain Pain Assessment: No/denies pain    Home Living Family/patient expects to be discharged to:: Assisted living               Home Equipment: Walker - 4 wheels;Hand held shower head;Shower seat - built in;Grab bars - toilet;Grab bars - tub/shower      Prior Function Level of Independence: Independent with assistive device(s)         Comments: uses Rollator for ambulation, uses "spa bathroom" for showering 2 days a week; uses SCAT for transportation     Hand Dominance   Dominant Hand: Right    Extremity/Trunk Assessment   Upper Extremity Assessment Upper Extremity Assessment: Defer to OT evaluation    Lower Extremity Assessment Lower Extremity Assessment: Generalized weakness    Cervical / Trunk Assessment Cervical / Trunk Assessment: Other exceptions Cervical /  Trunk Exceptions: large pannus  Communication   Communication: No difficulties  Cognition Arousal/Alertness: Awake/alert Behavior During Therapy: WFL for tasks assessed/performed Overall Cognitive Status: Within Functional Limits for tasks assessed                                        General Comments      Exercises     Assessment/Plan    PT Assessment Patient needs continued PT services  PT Problem List Decreased balance;Decreased activity tolerance;Decreased mobility;Decreased coordination;Decreased knowledge of use of  DME;Decreased safety awareness;Decreased knowledge of precautions;Cardiopulmonary status limiting activity       PT Treatment Interventions DME instruction;Gait training;Stair training;Functional mobility training;Therapeutic activities;Therapeutic exercise;Balance training;Neuromuscular re-education;Patient/family education    PT Goals (Current goals can be found in the Care Plan section)  Acute Rehab PT Goals Patient Stated Goal: to be able to do more for herself PT Goal Formulation: With patient Time For Goal Achievement: 02/03/18 Potential to Achieve Goals: Good    Frequency Min 2X/week   Barriers to discharge        Co-evaluation               AM-PAC PT "6 Clicks" Daily Activity  Outcome Measure Difficulty turning over in bed (including adjusting bedclothes, sheets and blankets)?: None Difficulty moving from lying on back to sitting on the side of the bed? : None Difficulty sitting down on and standing up from a chair with arms (e.g., wheelchair, bedside commode, etc,.)?: A Lot Help needed moving to and from a bed to chair (including a wheelchair)?: A Little Help needed walking in hospital room?: A Little Help needed climbing 3-5 steps with a railing? : Total 6 Click Score: 17    End of Session   Activity Tolerance: Patient limited by fatigue Patient left: in bed;with call bell/phone within reach;with bed alarm set Nurse Communication: Mobility status PT Visit Diagnosis: Muscle weakness (generalized) (M62.81)    Time: 2595-6387 PT Time Calculation (min) (ACUTE ONLY): 26 min   Charges:   PT Evaluation $PT Eval Moderate Complexity: 1 Mod PT Treatments $Therapeutic Activity: 8-22 mins   PT G Codes:        [place], PT, DPT [place] 01/20/2018, 5:43 PM

## 2018-01-21 LAB — BASIC METABOLIC PANEL
ANION GAP: 14 (ref 5–15)
BUN: 19 mg/dL (ref 6–20)
CHLORIDE: 98 mmol/L — AB (ref 101–111)
CO2: 28 mmol/L (ref 22–32)
Calcium: 9.1 mg/dL (ref 8.9–10.3)
Creatinine, Ser: 1.15 mg/dL — ABNORMAL HIGH (ref 0.44–1.00)
GFR calc Af Amer: >60 mL/min (ref >60)
GFR, EST NON AFRICAN AMERICAN: 53 mL/min — AB (ref >60)
GLUCOSE: 91 mg/dL (ref 65–99)
POTASSIUM: 3.9 mmol/L (ref 3.5–5.1)
Sodium: 140 mmol/L (ref 135–145)

## 2018-01-21 LAB — MAGNESIUM: Magnesium: 1.9 mg/dL (ref 1.7–2.4)

## 2018-01-21 MED ORDER — TORSEMIDE 20 MG PO TABS
20.0000 mg | ORAL_TABLET | Freq: Two times a day (BID) | ORAL | Status: DC
  Administered 2018-01-21 – 2018-01-22 (×3): 20 mg via ORAL
  Filled 2018-01-21 (×3): qty 1

## 2018-01-21 NOTE — Progress Notes (Signed)
PROGRESS NOTE    Gabriella Roth  SFK:[id number] DOB: [birth year] DOA: 01/17/2018 PCP: Lesia Hausen, PA   Brief Narrative:   57 year old female with morbid obesity, essential hypertension, vestibular schwannoma status post radiation, hypothyroidism came to the ER with complains of right shoulder pain and syncope about a week ago.  During her syncope she ended up hurting her right shoulder but did not have any warning signs prior to her syncope.  Since then she has been complaining of pain.  In the ER she was noted to have 88 percent saturation on room air.  For CT of the chest when she was laid down she felt extremely short of breath therefore she was admitted to the hospital.  CT of the chest was negative but showed concerns of possible pulmonary hypertension and cardiomegaly.  Assessment & Plan:   Principal Problem:   Acute respiratory failure with hypoxia (HCC) Active Problems:   HTN (hypertension)   Right shoulder pain  Acute respiratory distress with hypoxia; improving Obesity hypoventilation syndrome and/or obstructive sleep apnea; Non Compliant Severe pulmonary arterial hypertension with moderate right ventricular systolic dysfunction - Patient has been noncompliant with her CPAP.  Given her body habitus a suspect she has obstructive sleep apnea and/or obesity hypoventilation syndrome -CTA chest is negative for pulmonary embolism but suggested elevated pulmonary arterial pressure - Echocardiogram showed ejection fraction 55% with grade 1 diastolic dysfunction, elevated PA pressure at 86 suggestive of severe pulmonary arterial hypertension with moderate right ventricular systolic dysfunction -Plan to give Lasix 80 mg IV twice today -  bedtime CPAP machine -I have discussed the case with Pulmonary (Dr Carson Myrtle and Dr Elsworth Soho) and Cardiology (Dr Lennie Muckle further inpatient workup indicated once her breathing is improved, she can follow-up outpatient at the heart failure clinic for possible  right heart catheterization and from there on can be referred to pulmonary clinic for further care.  Also spoke with Dr. Jeffie Pollock from cardiology this morning who advised getting in touch with outpatient coordinator tomorrow to arrange for follow-up and likely eventual right heart catheterization. -At this point patient is very hesitant to use outpatient CPAP and/or BiPAP.  Have advised her the importance of using NIPPV especially when she is sleeping to avoid worsening of this but patient continues to refuse and is very reluctant. -We will also send her out on torsemide.  Will start 20 mg twice daily  Essential hypertension -on home verapamil 240 mg daily, metoprolol 12.5 mg twice daily  Hypothyroidism -Continue Synthroid 50 mcg -TSH - Normal 3.5  GERD -Continue Pepcid 20 mg twice daily  Right shoulder pain -Musculoskeletal in nature, pain control  DVT prophylaxis: Lovenox Code Status: Full code Family Communication: None at bedside Disposition Plan: Maintain inpatient stay for another day, will discharge her tomorrow.  Consultants:   Curbside pulmonary and cardiology  Procedures:   None none  Antimicrobials:   None   Subjective: No acute events overnight. Patient used BiPAP/CPAP  Review of Systems Otherwise negative except as per HPI, including: HEENT/EYES = negative for pain, redness, loss of vision, double vision, blurred vision, loss of hearing, sore throat, hoarseness, dysphagia Cardiovascular= negative for chest pain, palpitation, murmurs, lower extremity swelling Respiratory/lungs= negative for  cough, hemoptysis, wheezing, mucus production Gastrointestinal= negative for nausea, vomiting,, abdominal pain, melena, hematemesis Genitourinary= negative for Dysuria, Hematuria, Change in Urinary Frequency MSK = Negative for arthralgia, myalgias, Back Pain, Joint swelling  Neurology= Negative for headache, seizures, numbness, tingling  Psychiatry= Negative for  anxiety, depression, suicidal and homocidal ideation  Allergy/Immunology= Medication/Food allergy as listed  Skin= Negative for Rash, lesions, ulcers, itching   Objective: Vitals:   01/20/18 2104 01/20/18 2356 01/21/18 0730 01/21/18 0734  BP: 116/80 130/78  99/68  Pulse: 80 78 78 76  Resp:  20 20 20   Temp:  (!) 97.5 F (36.4 C) (!) 97.4 F (36.3 C) 97.9 F (36.6 C)  TempSrc:  Oral Oral Oral  SpO2:  (!) 83% 92% (!) 87%  Weight:      Height:        Intake/Output Summary (Last 24 hours) at 01/21/2018 1346 Last data filed at 01/21/2018 1000 Gross per 24 hour  Intake 600 ml  Output 2800 ml  Net -2200 ml   Filed Weights   01/18/18 1156  Weight: (!) 140.8 kg (310 lb 6.5 oz)    Examination:  Constitutional: NAD, calm, comfortable, morbidly obese.  Eyes: PERRL, lids and conjunctivae normal ENMT: Mucous membranes are moist. Posterior pharynx clear of any exudate or lesions.Normal dentition.  Neck: normal, supple, no masses, no thyromegaly Respiratory: diffuse diminished BS Cardiovascular: Regular rate and rhythm, no murmurs / rubs / gallops. No extremity edema. 2+ pedal pulses. No carotid bruits.  Abdomen: no tenderness, no masses palpated. No hepatosplenomegaly. Bowel sounds positive.  Musculoskeletal: no clubbing / cyanosis. No joint deformity upper and lower extremities. Good ROM, no contractures. Normal muscle tone.  Skin: no rashes, lesions, ulcers. No induration Neurologic: CN 2-12 grossly intact. Sensation intact, DTR normal. Strength 5/5 in all 4.  Psychiatric: Normal judgment and insight. Alert and oriented x 3. Normal mood.     Data Reviewed:   CBC: Recent Labs  Lab 01/17/18 1609  WBC 8.3  HGB 14.7  HCT 48.5*  MCV 89.5  PLT 188   Basic Metabolic Panel: Recent Labs  Lab 01/17/18 1609 01/18/18 0144 01/19/18 1554 01/20/18 0300 01/21/18 0325  NA 140 141 144 140 140  K 3.8 3.9 4.2 3.5 3.9  CL 105 106 106 100* 98*  CO2 25 23 28 27 28   GLUCOSE 102* 99 91  88 91  BUN 12 12 14 16 19   CREATININE 1.07* 1.05* 1.30* 1.17* 1.15*  CALCIUM 9.1 9.2 9.3 9.2 9.1  MG  --   --   --  1.9 1.9   GFR: Estimated Creatinine Clearance: 79.9 mL/min (A) (by C-G formula based on SCr of 1.15 mg/dL (H)). Liver Function Tests: No results for input(s): AST, ALT, ALKPHOS, BILITOT, PROT, ALBUMIN in the last 168 hours. No results for input(s): LIPASE, AMYLASE in the last 168 hours. No results for input(s): AMMONIA in the last 168 hours. Coagulation Profile: No results for input(s): INR, PROTIME in the last 168 hours. Cardiac Enzymes: Recent Labs  Lab 01/18/18 0144 01/18/18 1154 01/18/18 1718  TROPONINI <0.03 <0.03 <0.03   BNP (last 3 results) No results for input(s): PROBNP in the last 8760 hours. HbA1C: No results for input(s): HGBA1C in the last 72 hours. CBG: No results for input(s): GLUCAP in the last 168 hours. Lipid Profile: No results for input(s): CHOL, HDL, LDLCALC, TRIG, CHOLHDL, LDLDIRECT in the last 72 hours. Thyroid Function Tests: Recent Labs    01/19/18 0934  TSH 3.537   Anemia Panel: No results for input(s): VITAMINB12, FOLATE, FERRITIN, TIBC, IRON, RETICCTPCT in the last 72 hours. Sepsis Labs: No results for input(s): PROCALCITON, LATICACIDVEN in the last 168 hours.  No results found for this or any previous visit (from the past 240 hour(s)).       Radiology Studies: No  results found.      Scheduled Meds: . citalopram  40 mg Oral Daily  . darifenacin  15 mg Oral Daily  . dicyclomine  20 mg Oral TID AC  . enoxaparin (LOVENOX) injection  40 mg Subcutaneous Q24H  . famotidine  20 mg Oral BID  . hydrOXYzine  25 mg Oral BID  . levothyroxine  50 mcg Oral QAC breakfast  . metoprolol tartrate  12.5 mg Oral BID  . potassium chloride SA  20 mEq Oral Daily  . traMADol  50 mg Oral Q12H  . verapamil  240 mg Oral Daily   Continuous Infusions:   LOS: 3 days    Time spent: 35 mins    Ankit Arsenio Loader, MD [facility] Pager [phone]   If 7PM-7AM, please contact night-coverage [email or web address] Password [email or web address] 01/21/2018, 1:46 PM

## 2018-01-21 NOTE — Progress Notes (Addendum)
Patient saturations drop to 83% while she is bathing herself. Patient educated on the need for oxygen during activity to prevent hypoxia. Patient refuse to let staff assist with bath to lessen O2 demand and refuse to allow staff to place supplemental oxygen via nasal cannula. MD made aware via text page. Staff will continue to educate patient and use supplemental oxygen as patient will allow.   1535: Patient allows RN to place oxygen and accepts education at this time. patient admits to being scared of needing to use oxygen once discharged. Patient is concerned with the cost of oxygen. She states needing the oxygen all the time will prohibit her from taking showers and shopping. Emotional support provided to patient. Spiritual consult ordered at request of patient for additional support.

## 2018-01-21 NOTE — Progress Notes (Signed)
Patient in bed with 3 lpm of oxygen vial nasal canula.  No signs or symptoms of respiratory distress noted.  Patient was medicated with Norco x 1 this shift for shoulder pain.  Patient is resting comfortably at this time.  Safety and comfort measures maintained.  Call bell within reach.

## 2018-01-22 LAB — BASIC METABOLIC PANEL
ANION GAP: 10 (ref 5–15)
BUN: 18 mg/dL (ref 6–20)
CO2: 30 mmol/L (ref 22–32)
Calcium: 9.1 mg/dL (ref 8.9–10.3)
Chloride: 98 mmol/L — ABNORMAL LOW (ref 101–111)
Creatinine, Ser: 1.16 mg/dL — ABNORMAL HIGH (ref 0.44–1.00)
GFR calc Af Amer: >60 mL/min (ref >60)
GFR, EST NON AFRICAN AMERICAN: 52 mL/min — AB (ref >60)
GLUCOSE: 118 mg/dL — AB (ref 65–99)
POTASSIUM: 3.5 mmol/L (ref 3.5–5.1)
Sodium: 138 mmol/L (ref 135–145)

## 2018-01-22 LAB — MAGNESIUM: Magnesium: 2 mg/dL (ref 1.7–2.4)

## 2018-01-22 MED ORDER — TORSEMIDE 20 MG PO TABS: 20.0000 mg | ORAL_TABLET | Freq: Two times a day (BID) | ORAL | 0 refills | Status: DC

## 2018-01-22 MED ORDER — HYDROCODONE-ACETAMINOPHEN 5-325 MG PO TABS: 1.0000 | ORAL_TABLET | Freq: Four times a day (QID) | ORAL | 0 refills | Status: AC | PRN

## 2018-01-22 MED ORDER — DICYCLOMINE HCL 10 MG PO CAPS
20.0000 mg | ORAL_CAPSULE | Freq: Three times a day (TID) | ORAL | Status: DC
  Administered 2018-01-22 (×2): 20 mg via ORAL
  Filled 2018-01-22 (×4): qty 2

## 2018-01-22 MED ORDER — TORSEMIDE 20 MG PO TABS: 20.0000 mg | ORAL_TABLET | Freq: Two times a day (BID) | ORAL | 0 refills | Status: AC

## 2018-01-22 MED ORDER — HYDROCODONE-ACETAMINOPHEN 5-325 MG PO TABS: 1.0000 | ORAL_TABLET | Freq: Four times a day (QID) | ORAL | 0 refills | Status: DC | PRN

## 2018-01-22 NOTE — Discharge Summary (Signed)
Physician Discharge Summary  [place] Lish TIR:[id number] DOB: [birth year] DOA: 01/17/2018  PCP: Lesia Hausen, PA  Admit date: 01/17/2018 Discharge date: 01/22/2018  Admitted From:Assited Living  Disposition: Assisted living  Recommendations for Outpatient Follow-up:  1. Follow up with PCP in 1-2 weeks 2. Please obtain BMP/CBC in one week your next doctors visit.  3. Patient needs to get outpatient sleep study within the next 7-10 days as she will likely require CPAP at night.   4. Need to follow-up outpatient with pulmonary but prior to that she needs to get a right heart catheterization with heart failure clinic.  Arrangements have been made.  Home Health: None Equipment/Devices: Oxygen 4 L nasal cannula around-the-clock Discharge Condition: Stable CODE STATUS: Full code Diet recommendation: Low-fat cardiac diet  Brief/Interim Summary: 57 year old female with morbid obesity, essential hypertension, vestibular schwannoma status post radiation, hypothyroidism came to the ER with complains of right shoulder pain and syncope about a week ago.  During her syncope she ended up hurting her right shoulder but did not have any warning signs prior to her syncope.  Since then she has been complaining of pain.  In the ER she was noted to have 88 percent saturation on room air.  For CT of the chest when she was laid down she felt extremely short of breath therefore she was admitted to the hospital.  CT of the chest was negative but showed concerns of possible pulmonary hypertension and cardiomegaly.  Subsequently patient had echocardiogram which showed ejection fraction 67%, grade 1 diastolic dysfunction but severely elevated pulmonary arterial hypertension of 54mmhg with moderate right ventricular systolic dysfunction.  She was diuresed intermittently with IV Lasix to which she responded well.  I discussed the case with pulmonary and cardiology who recommended outpatient follow-up as patient will need right  heart catheterization.  Outpatient heart failure clinic follow-up arrangements were made as listed as below. She will need outpatient sleep study as will likely require NIPPV especially when sleeping but at this time patient seems very reluctant to get this done.  I have explained her risks and benefit of importance of treating hypoxia with suspected underlying obstructive sleep apnea versus obesity hypoventilation syndrome.  Nurse was present in the room when I explained her condition in detail including her hospital stay. Dr Jeffie Pollock recommended possibly sending patient home on torsemide 20 mg twice daily therefore will prescribe a 7-day course in the meantime she needs to follow-up outpatient. Patient remained hypoxic in the hospital and oxygen saturation dropping to almost mid 80s at rest and with ambulation.  Patient requires 4 L nasal cannula for now and arrangements will be made at the time of discharge. She has reached maximum benefit from an hospital stay therefore stable to be discharged today.  No new complaints this morning.  HEENT/EYES = negative for pain, redness, loss of vision, double vision, blurred vision, loss of hearing, sore throat, hoarseness, dysphagia Cardiovascular= negative for chest pain, palpitation, murmurs, lower extremity swelling Respiratory/lungs= negative for shortness of breath, cough, hemoptysis, wheezing, mucus production Gastrointestinal= negative for nausea, vomiting,, abdominal pain, melena, hematemesis Genitourinary= negative for Dysuria, Hematuria, Change in Urinary Frequency MSK = Negative for arthralgia, myalgias, Back Pain, Joint swelling  Neurology= Negative for headache, seizures, numbness, tingling  Psychiatry= Negative for anxiety, depression, suicidal and homocidal ideation Allergy/Immunology= Medication/Food allergy as listed  Skin= Negative for Rash, lesions, ulcers, itching    Discharge Diagnoses:  Principal Problem:   Acute respiratory  failure with hypoxia (HCC) Active Problems:   HTN (  hypertension)   Right shoulder pain  Acute respiratory distress with hypoxia; improved Obesity hypoventilation syndrome and/or obstructive sleep apnea; Non Compliant Chronic hypoxic respiratory failure, multifactorial Severe pulmonary arterial hypertension with moderate right ventricular systolic dysfunction -Patient is refusing any sort of NIPPV in the hospital especially when sleeping.  I have advised her she will need outpatient sleep study and likely end up requiring to wear CPAP or BiPAP overnight. -In the meantime due to hypoxia will arrange for 4 L nasal cannula to be worn around the clock. -CTA chest is negative for pulmonary embolism but suggested elevated pulmonary arterial pressure - Echocardiogram showed ejection fraction 55% with grade 1 diastolic dysfunction, elevated PA pressure at 86 suggestive of severe pulmonary arterial hypertension with moderate right ventricular systolic dysfunction -Discharged on torsemide 20 mg orally BID -I have discussed the case with Pulmonary (Dr Carson Myrtle and Dr Elsworth Soho) and Cardiology (Dr Lennie Muckle further inpatient workup indicated once her breathing is improved, she can follow-up outpatient at the heart failure clinic for possible right heart catheterization and from there on can be referred to pulmonary clinic for further care.  Also spoke with Dr. Jeffie Pollock from cardiology who advised getting in touch with outpatient coordinator for [place].  Arrangements have been made. -At this point patient is very hesitant to use outpatient CPAP and/or BiPAP.  Have advised her the importance of using NIPPV especially when she is sleeping to avoid worsening of this but patient continues to refuse and is very reluctant.  Essential hypertension -on home verapamil 240 mg daily, metoprolol 12.5 mg twice daily  Hypothyroidism -Continue Synthroid 50 mcg -TSH - Normal 3.5  GERD -Continue Pepcid 20 mg twice  daily  Right shoulder pain -Musculoskeletal in nature, pain control    Discharge Instructions  Discharge Instructions    AMB referral to CHF clinic   Complete by:  As directed    Discussed with Dr Jeffie Pollock. Notified the service who will help schedule the appointment.   Call MD for:  difficulty breathing, headache or visual disturbances   Complete by:  As directed    Diet - low sodium heart healthy   Complete by:  As directed    Increase activity slowly   Complete by:  As directed      Allergies as of 01/22/2018      Reactions   Avapro [irbesartan]    Unknown, listed on MAR   Benadryl [diphenhydramine] Other (See Comments)   On MAR   Codeine    Unknown, listed on MAR   Ibuprofen Nausea Only   Nexium [esomeprazole Magnesium] Other (See Comments)   On MAR   Prevacid [lansoprazole] Other (See Comments)   On MAR   Prilosec [omeprazole]    Unknown, listed on MAR   Vasotec [enalapril]    Unknown, listed on MAR   Vistaril [hydroxyzine Hcl]    "jittery"   Zyrtec [cetirizine] Hives   Bactrim [sulfamethoxazole-trimethoprim] Rash   Clindamycin/lincomycin    Heartburn   Keflex [cephalexin] Rash      Medication List    STOP taking these medications   lidocaine 5 % Commonly known as:  LIDODERM     TAKE these medications   camphor-menthol lotion Commonly known as:  SARNA Apply 1 application topically 2 (two) times daily as needed for itching.   citalopram 40 MG tablet Commonly known as:  CELEXA Take 40 mg by mouth daily.   dicyclomine 20 MG tablet Commonly known as:  BENTYL Take 20 mg by mouth 3 (three) times  daily before meals.   HYDROcodone-acetaminophen 5-325 MG tablet Commonly known as:  NORCO Take 1 tablet by mouth every 6 (six) hours as needed for up to 15 doses for moderate pain.   hydrOXYzine 25 MG tablet Commonly known as:  ATARAX/VISTARIL Take 25 mg by mouth 2 (two) times daily.   levothyroxine 50 MCG tablet Commonly known as:  SYNTHROID,  LEVOTHROID Take 50 mcg by mouth daily before breakfast.   metoprolol tartrate 25 MG tablet Commonly known as:  LOPRESSOR Take 12.5 mg by mouth 2 (two) times daily.   MYRBETRIQ 25 MG Tb24 tablet Generic drug:  mirabegron ER Take 25 mg by mouth daily.   nystatin powder Generic drug:  nystatin Apply 1-2 g topically 3 (three) times daily as needed (for rash).   potassium chloride SA 20 MEQ tablet Commonly known as:  K-DUR,KLOR-CON Take 20 mEq by mouth daily.   ranitidine 150 MG tablet Commonly known as:  ZANTAC Take 150 mg by mouth daily.   torsemide 20 MG tablet Commonly known as:  DEMADEX Take 1 tablet (20 mg total) by mouth 2 (two) times daily for 15 days.   traMADol 50 MG tablet Commonly known as:  ULTRAM Take 25 mg by mouth 2 (two) times daily.   verapamil 240 MG 24 hr capsule Commonly known as:  VERELAN PM Take 240 mg by mouth daily.            Durable Medical Equipment  (From admission, onward)        Start     Ordered   01/22/18 1404  For home use only DME oxygen  Once    Question Answer Comment  Mode or (Route) Nasal cannula   Liters per Minute 4   Frequency Continuous (stationary and portable oxygen unit needed)   Oxygen delivery system Gas      01/22/18 1403     Follow-up Information    Shirley Friar, PA-C Follow up on 02/05/2018.   Specialty:  Physician Assistant Why:  Heart Failure Follow up @ 2pm-Parking at ER lot (enter through blue awning to left of ER), or underneath [place] [address] (construction entrance, garage code: 1200, elevator to 1st floor). Take all am meds, bring all med bottles. Contact information: [place] [address] [phone]        Lesia Hausen, [place]. Schedule an appointment as soon as possible for a visit in 1 week(s).   Specialty:  Internal Medicine Contact information: [address] [place] [address] [phone]          Allergies  Allergen Reactions   . Avapro [Irbesartan]     Unknown, listed on MAR  . Benadryl [Diphenhydramine] Other (See Comments)    On MAR  . Codeine     Unknown, listed on MAR  . Ibuprofen Nausea Only  . Nexium [Esomeprazole Magnesium] Other (See Comments)    On MAR  . Prevacid [Lansoprazole] Other (See Comments)    On MAR  . Prilosec [Omeprazole]     Unknown, listed on MAR  . Vasotec [Enalapril]     Unknown, listed on MAR  . Vistaril [Hydroxyzine Hcl]     "jittery"  . Zyrtec [Cetirizine] Hives  . Bactrim [Sulfamethoxazole-Trimethoprim] Rash  . Clindamycin/Lincomycin     Heartburn  . Keflex [Cephalexin] Rash    You were cared for by a hospitalist during your hospital stay. If you have any questions about your discharge medications or the care you received  while you were in the hospital after you are discharged, you can call the unit and asked to speak with the hospitalist on call if the hospitalist that took care of you is not available. Once you are discharged, your primary care physician will handle any further medical issues. Please note that no refills for any discharge medications will be authorized once you are discharged, as it is imperative that you return to your primary care physician (or establish a relationship with a primary care physician if you do not have one) for your aftercare needs so that they can reassess your need for medications and monitor your lab values.  Consultations:  Curbside cardiology and pulmonary   Procedures/Studies: Dg Chest 2 View  Result Date: 01/17/2018 CLINICAL DATA:  Syncopal episode today.  Shortness of breath. EXAM: CHEST - 2 VIEW COMPARISON:  10/03/2016 FINDINGS: The heart size and mediastinal contours are within normal limits. Both lungs are clear. The visualized skeletal structures are unremarkable. IMPRESSION: Stable exam.  No active cardiopulmonary disease. Electronically Signed   By: Earle Gell M.D.   On: 01/17/2018 17:13   Dg Shoulder Right  Result Date:  01/17/2018 CLINICAL DATA:  Fall in parking lot. Right shoulder pain. Initial encounter. EXAM: RIGHT SHOULDER - 2+ VIEW COMPARISON:  None. FINDINGS: There is no evidence of fracture or dislocation. There is no evidence of arthropathy or other focal bone abnormality. Soft tissues are unremarkable. IMPRESSION: Negative. Electronically Signed   By: Earle Gell M.D.   On: 01/17/2018 17:14   Ct Angio Chest Pe W/cm &/or Wo Cm  Result Date: 01/17/2018 CLINICAL DATA:  57 y/o  F; hypoxia. EXAM: CT ANGIOGRAPHY CHEST WITH CONTRAST TECHNIQUE: Multidetector CT imaging of the chest was performed using the standard protocol during bolus administration of intravenous contrast. Multiplanar CT image reconstructions and MIPs were obtained to evaluate the vascular anatomy. CONTRAST:  143mL ISOVUE-370 IOPAMIDOL (ISOVUE-370) INJECTION 76% COMPARISON:  01/17/2018 chest radiograph FINDINGS: Cardiovascular: Mild cardiomegaly. Normal caliber thoracic aorta with mild atherosclerosis. Enlarged main pulmonary artery measuring up to 3.7 cm. No pericardial effusion. Satisfactory opacification of the pulmonary arteries. No pulmonary embolus identified. Mediastinum/Nodes: No enlarged mediastinal, hilar, or axillary lymph nodes. Thyroid gland, trachea, and esophagus demonstrate no significant findings. Lungs/Pleura: Mosaic attenuation of the lung parenchyma. Platelike opacity at the right lung base. No pleural effusion or pneumothorax. Upper Abdomen: Partially visualized gastric bypass postsurgical changes. Cholecystectomy. Nonspecific densities in the right lobe of liver, probably small granuloma. Musculoskeletal: No chest wall abnormality. No acute or significant osseous findings. Review of the MIP images confirms the above findings. IMPRESSION: 1. No pulmonary embolus identified. 2. Mosaic attenuation of lung parenchyma may represent small vessel or small airways disease. 3. Platelike opacity at the right lung base, probably atelectasis. 4.  Mild cardiomegaly and enlarged main pulmonary artery which may represent pulmonary artery hypertension. Electronically Signed   By: Kristine Garbe M.D.   On: 01/17/2018 22:59   Mm Screening Breast Tomo Bilateral  Result Date: 01/09/2018 CLINICAL DATA:  Screening. EXAM: DIGITAL SCREENING BILATERAL MAMMOGRAM WITH TOMO AND CAD COMPARISON:  Previous exam(s). ACR Breast Density Category b: There are scattered areas of fibroglandular density. FINDINGS: There are no findings suspicious for malignancy. Images were processed with CAD. IMPRESSION: No mammographic evidence of malignancy. A result letter of this screening mammogram will be mailed directly to the patient. RECOMMENDATION: Screening mammogram in one year. (Code:SM-B-01Y) BI-RADS CATEGORY  1: Negative. Electronically Signed   By: Lajean Manes M.D.   On: 01/09/2018  16:03      Subjective:   Discharge Exam: Vitals:   01/22/18 0059 01/22/18 0927  BP: 108/71 107/68  Pulse: 77 96  Resp:  20  Temp: (!) 97.4 F (36.3 C) (!) 97.5 F (36.4 C)  SpO2: (!) 85% 93%   Vitals:   01/21/18 0802 01/21/18 1615 01/22/18 0059 01/22/18 0927  BP:  (!) 118/58 108/71 107/68  Pulse:  69 77 96  Resp:  19  20  Temp:  98.1 F (36.7 C) (!) 97.4 F (36.3 C) (!) 97.5 F (36.4 C)  TempSrc:  Oral Oral Oral  SpO2: 93% 93% (!) 85% 93%  Weight:      Height:        General: Pt is alert, awake, not in acute distress, morbidly obese Cardiovascular: RRR, S1/S2 +, no rubs, no gallops Respiratory: Diffuse diminished breath sounds Abdominal: Soft, NT, ND, bowel sounds + Extremities: no edema, no cyanosis    The results of significant diagnostics from this hospitalization (including imaging, microbiology, ancillary and laboratory) are listed below for reference.     Microbiology: No results found for this or any previous visit (from the past 240 hour(s)).   Labs: BNP (last 3 results) Recent Labs    01/17/18 1609  BNP 563.8*   Basic Metabolic  Panel: Recent Labs  Lab 01/18/18 0144 01/19/18 1554 01/20/18 0300 01/21/18 0325 01/22/18 0654  NA 141 144 140 140 138  K 3.9 4.2 3.5 3.9 3.5  CL 106 106 100* 98* 98*  CO2 23 28 27 28 30   GLUCOSE 99 91 88 91 118*  BUN 12 14 16 19 18   CREATININE 1.05* 1.30* 1.17* 1.15* 1.16*  CALCIUM 9.2 9.3 9.2 9.1 9.1  MG  --   --  1.9 1.9 2.0   Liver Function Tests: No results for input(s): AST, ALT, ALKPHOS, BILITOT, PROT, ALBUMIN in the last 168 hours. No results for input(s): LIPASE, AMYLASE in the last 168 hours. No results for input(s): AMMONIA in the last 168 hours. CBC: Recent Labs  Lab 01/17/18 1609  WBC 8.3  HGB 14.7  HCT 48.5*  MCV 89.5  PLT 237   Cardiac Enzymes: Recent Labs  Lab 01/18/18 0144 01/18/18 1154 01/18/18 1718  TROPONINI <0.03 <0.03 <0.03   BNP: Invalid input(s): POCBNP CBG: No results for input(s): GLUCAP in the last 168 hours. D-Dimer No results for input(s): DDIMER in the last 72 hours. Hgb A1c No results for input(s): HGBA1C in the last 72 hours. Lipid Profile No results for input(s): CHOL, HDL, LDLCALC, TRIG, CHOLHDL, LDLDIRECT in the last 72 hours. Thyroid function studies No results for input(s): TSH, T4TOTAL, T3FREE, THYROIDAB in the last 72 hours.  Invalid input(s): FREET3 Anemia work up No results for input(s): VITAMINB12, FOLATE, FERRITIN, TIBC, IRON, RETICCTPCT in the last 72 hours. Urinalysis    Component Value Date/Time   COLORURINE YELLOW 04/30/2015 2132   APPEARANCEUR CLEAR 04/30/2015 2132   LABSPEC 1.020 04/30/2015 2132   PHURINE 7.5 04/30/2015 2132   GLUCOSEU NEGATIVE 04/30/2015 2132   HGBUR NEGATIVE 04/30/2015 2132   BILIRUBINUR NEGATIVE 04/30/2015 2132   KETONESUR NEGATIVE 04/30/2015 2132   PROTEINUR NEGATIVE 04/30/2015 2132   UROBILINOGEN 1.0 04/30/2015 2132   NITRITE NEGATIVE 04/30/2015 2132   LEUKOCYTESUR LARGE (A) 04/30/2015 2132   Sepsis Labs Invalid input(s): PROCALCITONIN,  WBC,  LACTICIDVEN Microbiology No  results found for this or any previous visit (from the past 240 hour(s)).   Time coordinating discharge: 35 mins  SIGNED:  Rya Rausch Arsenio Loader, MD  [facility] 01/22/2018, 2:03 PM Pager   If 7PM-7AM, please contact night-coverage [email or web address] Password [email or web address]

## 2018-01-22 NOTE — Progress Notes (Signed)
Visited patient as requested.  Had conversation and she shared mamy challenges she is facing- started on oxygen and concerned about insurance paying for it and providing it. Her income sounds very limited.  Hopefully when the social worker meets with her she will likely have questions about some of these things.   She shared how her pharmacy had her meds but she could not pay and had to go without meds for 2 weeks which was a terrible experience all the way around.  She changed to another pharmacy. Provided emotional and spiritual support and had prayer with the patient. Conard Novak, Chaplain   01/22/18 1100  Clinical Encounter Type  Visited With Patient  Visit Type Initial;Spiritual support  Referral From Physician  Consult/Referral To Nurse  Spiritual Encounters  Spiritual Needs Prayer;Emotional  Stress Factors  Patient Stress Factors Financial concerns;Health changes  Family Stress Factors None identified

## 2018-01-22 NOTE — Progress Notes (Signed)
Patient will discharge to [facility] Anticipated discharge date: 4/22 Transportation by [facility] called at 4:35pm  CSW signing off.  Jorge Ny, LCSW Clinical Social Worker [phone]

## 2018-01-22 NOTE — NC FL2 (Addendum)
[place] LEVEL OF CARE SCREENING TOOL     IDENTIFICATION  Patient Name: Gabriella Roth Birthdate: [birth year] Sex: female Admission Date (Current Location): 01/17/2018  [facility] and [place] Number:  Herbalist and Address:  [facility], [place] [address], [place], [place] [address]      Provider Number: [id number]  Attending Physician Name and Address:  Damita Lack, MD  Relative Name and Phone Number:       Current Level of Care: Hospital Recommended Level of Care: [place] Prior Approval Number:    Date Approved/Denied:   PASRR Number:    Discharge Plan: Other (Comment)(ALF)    Current Diagnoses: Patient Active Problem List   Diagnosis Date Noted  . Acute respiratory failure with hypoxia ([place]) 01/18/2018  . HTN (hypertension) 01/18/2018  . Right shoulder pain 01/18/2018    Orientation RESPIRATION BLADDER Height & Weight     Self, Time, Situation, Place  O2(4L [place] continuous) Continent Weight: (!) 310 lb 6.5 oz (140.8 kg) Height:  5\' 5"  (165.1 cm)  BEHAVIORAL SYMPTOMS/MOOD NEUROLOGICAL BOWEL NUTRITION STATUS      Continent Diet(regular)  AMBULATORY STATUS COMMUNICATION OF NEEDS Skin   Limited Assist Verbally Normal                       Personal Care Assistance Level of Assistance  Bathing, Dressing Bathing Assistance: Limited assistance   Dressing Assistance: Limited assistance     Functional Limitations Info             SPECIAL CARE FACTORS FREQUENCY  PT (By licensed PT), OT (By licensed OT)     PT Frequency: 3/wk with home health OT Frequency: 3/wk with home health            Contractures      Additional Factors Info  Code Status, Allergies Code Status Info: FULL Allergies Info: Avapro Irbesartan, Benadryl Diphenhydramine, Codeine, Ibuprofen, Nexium Esomeprazole Magnesium, Prevacid Lansoprazole, Prilosec Omeprazole, Vasotec Enalapril, Vistaril Hydroxyzine Hcl, Zyrtec  Cetirizine, Bactrim Sulfamethoxazole-trimethoprim, Clindamycin/lincomycin, Keflex Cephalexin            Discharge Medications: STOP taking these medications          lidocaine 5 % Commonly known as:  LIDODERM                   TAKE these medications          camphor-menthol lotion Commonly known as:  SARNA Apply 1 application topically 2 (two) times daily as needed for itching.   citalopram 40 MG tablet Commonly known as:  CELEXA Take 40 mg by mouth daily.   dicyclomine 20 MG tablet Commonly known as:  BENTYL Take 20 mg by mouth 3 (three) times daily before meals.   HYDROcodone-acetaminophen 5-325 MG tablet Commonly known as:  NORCO Take 1 tablet by mouth every 6 (six) hours as needed for up to 15 doses for moderate pain.   hydrOXYzine 25 MG tablet Commonly known as:  ATARAX/VISTARIL Take 25 mg by mouth 2 (two) times daily.   levothyroxine 50 MCG tablet Commonly known as:  SYNTHROID, LEVOTHROID Take 50 mcg by mouth daily before breakfast.   metoprolol tartrate 25 MG tablet Commonly known as:  LOPRESSOR Take 12.5 mg by mouth 2 (two) times daily.   MYRBETRIQ 25 MG Tb24 tablet Generic drug:  mirabegron ER Take 25 mg by mouth daily.   nystatin powder Generic drug:  nystatin Apply 1-2 g  topically 3 (three) times daily as needed (for rash).   potassium chloride SA 20 MEQ tablet Commonly known as:  K-DUR,KLOR-CON Take 20 mEq by mouth daily.   ranitidine 150 MG tablet Commonly known as:  ZANTAC Take 150 mg by mouth daily.   torsemide 20 MG tablet Commonly known as:  DEMADEX Take 1 tablet (20 mg total) by mouth 2 (two) times daily for 15 days.   traMADol 50 MG tablet Commonly known as:  ULTRAM Take 25 mg by mouth 2 (two) times daily.   verapamil 240 MG 24 hr capsule Commonly known as:  VERELAN PM Take 240 mg by mouth daily.                         Relevant Imaging Results:  Relevant Lab Results:   Additional  Information pt will need 2L [place] continuous O2 at ALF  [place], Connye Burkitt, LCSW

## 2018-01-22 NOTE — Care Management Important Message (Signed)
Important Message  Patient Details  Name: Gabriella Roth MRN: [id number] Date of Birth: [birth year]   Medicare Important Message Given:  Yes    Jaimere Feutz 01/22/2018, 1:22 PM

## 2018-01-22 NOTE — Progress Notes (Addendum)
Pt discharged via [facility]. Patient refuses to wear oxygen at discharge after education and insistence by [facility]-EMTs and this RN. Cristela Blue, Domingo Sep, this RN, and patient are in agreement that if patient begins to decline that EMTs will treat as appropriate and return to ED.   Discharge instructions reviewed with patient by previous RN. Patient VSS and has all belongings with her.

## 2018-01-22 NOTE — Progress Notes (Signed)
SATURATION QUALIFICATIONS: (This note is used to comply with regulatory documentation for home oxygen)  Patient Saturations on Room Air at Rest = 93%  Patient Saturations on Room Air while Ambulating = 85%  Patient Saturations on 4 Liters of oxygen while Ambulating = 92%  Please briefly explain why patient needs home oxygen: Patient quickly desaturates her oxygen status during ambulation on room air.

## 2018-01-22 NOTE — Care Management Note (Signed)
Case Management Note  Patient Details  Name: [place] Fauteux MRN: [id number] Date of Birth: 1961-08-01  Subjective/Objective:   From [facility] ALF, plan to dc back there today, CSW aware.  She will need home oxygen  At discharge, agreed to have [facility] deliver oxygen.  She kept saying she can't afford.  NCM checked with Jermaine with [facility]  He states she does not have a co pay.  Brenton Grills will check to see what time it will be delivered and let NCM know , then NCM will informe CSW.                 Action/Plan: DC back to ALF today.    Expected Discharge Date:  01/22/18               Expected Discharge Plan:  Assisted Living / Rest Home  In-House Referral:  Clinical Social Work  Discharge planning Services  CM Consult  Post Acute Care Choice:  Durable Medical Equipment Choice offered to:  Patient  DME Arranged:  Oxygen DME Agency:  [place]:    [place] Agency:     Status of Service:  Completed, signed off  If discussed at [employer] of Stay Meetings, dates discussed:    Additional Comments:  Zenon Mayo, RN 01/22/2018, 3:06 PM

## 2018-02-05 ENCOUNTER — Ambulatory Visit (HOSPITAL_COMMUNITY)
Admission: RE | Admit: 2018-02-05 | Discharge: 2018-02-05 | Disposition: A | Discharge status: Home / Self Care | Payer: Medicare HMO | Source: Ambulatory Visit | Attending: Cardiology | Admitting: Cardiology

## 2018-02-05 ENCOUNTER — Ambulatory Visit (HOSPITAL_COMMUNITY): Payer: Medicare HMO

## 2018-02-05 VITALS — BP 110/60 | HR 102 | Wt 283.8 lb

## 2018-02-05 DIAGNOSIS — Z882 Allergy status to sulfonamides status: Secondary | ICD-10-CM | POA: Insufficient documentation

## 2018-02-05 DIAGNOSIS — Z885 Allergy status to narcotic agent status: Secondary | ICD-10-CM | POA: Insufficient documentation

## 2018-02-05 DIAGNOSIS — I5032 Chronic diastolic (congestive) heart failure: Secondary | ICD-10-CM | POA: Diagnosis not present

## 2018-02-05 DIAGNOSIS — Z6841 Body Mass Index (BMI) 40.0 and over, adult: Secondary | ICD-10-CM | POA: Diagnosis not present

## 2018-02-05 DIAGNOSIS — I110 Hypertensive heart disease with heart failure: Secondary | ICD-10-CM | POA: Insufficient documentation

## 2018-02-05 DIAGNOSIS — I272 Pulmonary hypertension, unspecified: Secondary | ICD-10-CM

## 2018-02-05 DIAGNOSIS — Z923 Personal history of irradiation: Secondary | ICD-10-CM | POA: Diagnosis not present

## 2018-02-05 DIAGNOSIS — Z9981 Dependence on supplemental oxygen: Secondary | ICD-10-CM | POA: Insufficient documentation

## 2018-02-05 DIAGNOSIS — R55 Syncope and collapse: Secondary | ICD-10-CM | POA: Insufficient documentation

## 2018-02-05 DIAGNOSIS — M25511 Pain in right shoulder: Secondary | ICD-10-CM | POA: Diagnosis not present

## 2018-02-05 DIAGNOSIS — Z79891 Long term (current) use of opiate analgesic: Secondary | ICD-10-CM | POA: Insufficient documentation

## 2018-02-05 DIAGNOSIS — E039 Hypothyroidism, unspecified: Secondary | ICD-10-CM | POA: Diagnosis not present

## 2018-02-05 DIAGNOSIS — Z886 Allergy status to analgesic agent status: Secondary | ICD-10-CM | POA: Insufficient documentation

## 2018-02-05 DIAGNOSIS — D3610 Benign neoplasm of peripheral nerves and autonomic nervous system, unspecified: Secondary | ICD-10-CM | POA: Diagnosis not present

## 2018-02-05 DIAGNOSIS — J9611 Chronic respiratory failure with hypoxia: Secondary | ICD-10-CM | POA: Insufficient documentation

## 2018-02-05 DIAGNOSIS — Z881 Allergy status to other antibiotic agents status: Secondary | ICD-10-CM | POA: Insufficient documentation

## 2018-02-05 DIAGNOSIS — Z79899 Other long term (current) drug therapy: Secondary | ICD-10-CM | POA: Insufficient documentation

## 2018-02-05 DIAGNOSIS — I2510 Atherosclerotic heart disease of native coronary artery without angina pectoris: Secondary | ICD-10-CM | POA: Insufficient documentation

## 2018-02-05 NOTE — Progress Notes (Addendum)
ADVANCED HF CLINIC CONSULT NOTE  Referring Physician: Dr. Harl Bowie Primary Cardiologist: Dr Haroldine Laws    HPI: Gabriella Roth is a 57 year old with a history of morbid obesity, HTN, hypothroid, and vestibular schwannoma with radiation. She was referred to the HF Clinic by Dr. Harl Bowie for further evaluation of her [place] and RV strain.   Admitted 01/17/18 with right shoulder pain and syncope. CT of chest was negative for PE but had concern for pumonary hypertension. ECHO completed and showed EF 55-60% grade IDD, RV severely dilated with moderate to severe RV dysfunction with peak PA pressure 86 mm Hg. Remained hypoxic so she was discharged on 4 liters oxygen. Discharge to [facility].   Today she presents for HF consultation. Arrived in the clinic off oxygen. Says she wears oxygen in her room but not outside. States she feels pretty good and only gets SOB after walking 500 feet. Denies orthopnea/pnd. Rare edema. No CP. Has dizziness from schwanoma. But no syncope or presyncope. She has not been weighing. Wants to cut back torsemide. All medications are provided by Assisted Living.   Family History- No know coronary disease.   Review of Systems: [y] = yes, [ ]  = no   General: Weight gain [ ] ; Weight loss [ ] ; Anorexia [ ] ; Fatigue [ ] ; Fever [ ] ; Chills [ ] ; Weakness [ ]   Cardiac: Chest pain/pressure [ ] ; Resting SOB [ ] ; Exertional SOB [Y ]; Orthopnea [ ] ; Pedal Edema [ ] ; Palpitations [ ] ; Syncope [ ] ; Presyncope [ ] ; Paroxysmal nocturnal dyspnea[ ]   Pulmonary: Cough [ ] ; Wheezing[ ] ; Hemoptysis[ ] ; Sputum [ ] ; Snoring [ ]   GI: Vomiting[ ] ; Dysphagia[ ] ; Melena[ ] ; Hematochezia [ ] ; Heartburn[ ] ; Abdominal pain [ ] ; Constipation [ ] ; Diarrhea [ ] ; BRBPR [ ]   GU: Hematuria[ ] ; Dysuria [ ] ; Nocturia[ ]   Vascular: Pain in legs with walking [ ] ; Pain in feet with lying flat [ ] ; Non-healing sores [ ] ; Stroke [ ] ; TIA [ ] ; Slurred speech [ ] ;  Neuro: Headaches[ ] ; Vertigo[ ] ; Seizures[ ] ;  Paresthesias[ ] ;Blurred vision [ ] ; Diplopia [ ] ; Vision changes [ ]   Ortho/Skin: Arthritis [Y ]; Joint pain [Y ]; Muscle pain [ ] ; Joint swelling [ ] ; Back Pain [Y ]; Rash [ ]   Psych: Depression[ ] ; Anxiety[ ]   Heme: Bleeding problems [ ] ; Clotting disorders [ ] ; Anemia [ ]   Endocrine: Diabetes [ ] ; Thyroid dysfunction[ ]    Past Medical History:  Diagnosis Date  . Cancer ([place])    Brain  . Coronary artery disease   . History of radiation therapy 08/01/2013   EBRT: Completed 45 Gy out of a planned 54 Gy, completed 08/01/13 [facility]  . Hypertension   . Hypothyroidism   . Obesity     Current Outpatient Medications  Medication Sig Dispense Refill  . camphor-menthol (SARNA) lotion Apply 1 application topically 2 (two) times daily as needed for itching.    . citalopram (CELEXA) 40 MG tablet Take 40 mg by mouth daily.    Marland Kitchen dicyclomine (BENTYL) 20 MG tablet Take 20 mg by mouth 3 (three) times daily before meals.    Marland Kitchen HYDROcodone-acetaminophen (NORCO) 5-325 MG tablet Take 1 tablet by mouth every 6 (six) hours as needed for up to 15 doses for moderate pain. 15 tablet 0  . hydrOXYzine (ATARAX/VISTARIL) 25 MG tablet Take 25 mg by mouth 2 (two) times daily.    Marland Kitchen levothyroxine (SYNTHROID, LEVOTHROID) 50 MCG tablet  Take 50 mcg by mouth daily before breakfast.    . metoprolol tartrate (LOPRESSOR) 25 MG tablet Take 12.5 mg by mouth 2 (two) times daily.    . mirabegron ER (MYRBETRIQ) 25 MG TB24 tablet Take 25 mg by mouth daily.    Marland Kitchen nystatin (MYCOSTATIN/NYSTOP) 100000 UNIT/GM POWD Apply 1-2 g topically 3 (three) times daily as needed (for rash).     . potassium chloride SA (K-DUR,KLOR-CON) 20 MEQ tablet Take 20 mEq by mouth daily.    . ranitidine (ZANTAC) 150 MG tablet Take 150 mg by mouth daily.    Marland Kitchen torsemide (DEMADEX) 20 MG tablet Take 1 tablet (20 mg total) by mouth 2 (two) times daily for 15 days. 30 tablet 0  . traMADol (ULTRAM) 50 MG tablet Take 25 mg by mouth 2 (two) times daily.     .  verapamil (VERELAN PM) 240 MG 24 hr capsule Take 240 mg by mouth daily.     No current facility-administered medications for this encounter.     Allergies  Allergen Reactions  . Avapro [Irbesartan]     Unknown, listed on MAR  . Benadryl [Diphenhydramine] Other (See Comments)    On MAR  . Codeine     Unknown, listed on MAR  . Ibuprofen Nausea Only  . Nexium [Esomeprazole Magnesium] Other (See Comments)    On MAR  . Prevacid [Lansoprazole] Other (See Comments)    On MAR  . Prilosec [Omeprazole]     Unknown, listed on MAR  . Vasotec [Enalapril]     Unknown, listed on MAR  . Vistaril [Hydroxyzine Hcl]     "jittery"  . Zyrtec [Cetirizine] Hives  . Bactrim [Sulfamethoxazole-Trimethoprim] Rash  . Clindamycin/Lincomycin     Heartburn  . Keflex [Cephalexin] Rash      Social History   Socioeconomic History  . Marital status: Widowed    Spouse name: Not on file  . Number of children: Not on file  . Years of education: Not on file  . Highest education level: Not on file  Occupational History  . Not on file  Social Needs  . Financial resource strain: Not on file  . Food insecurity:    Worry: Not on file    Inability: Not on file  . Transportation needs:    Medical: Not on file    Non-medical: Not on file  Tobacco Use  . Smoking status: Never Smoker  . Smokeless tobacco: Never Used  Substance and Sexual Activity  . Alcohol use: No  . Drug use: No  . Sexual activity: Never    Birth control/protection: None  Lifestyle  . Physical activity:    Days per week: Not on file    Minutes per session: Not on file  . Stress: Not on file  Relationships  . Social connections:    Talks on phone: Not on file    Gets together: Not on file    Attends religious service: Not on file    Active member of club or organization: Not on file    Attends meetings of clubs or organizations: Not on file    Relationship status: Not on file  . Intimate partner violence:    Fear of current  or ex partner: Not on file    Emotionally abused: Not on file    Physically abused: Not on file    Forced sexual activity: Not on file  Other Topics Concern  . Not on file  Social History Narrative  . Not on  file     No family history on file.  Vitals:   02/05/18 1017  BP: 110/60  Pulse: (!) 102  SpO2: (!) 88%  Weight: 283 lb 12.8 oz (128.7 kg)   Wt Readings from Last 3 Encounters:  02/05/18 283 lb 12.8 oz (128.7 kg)  01/18/18 (!) 310 lb 6.5 oz (140.8 kg)  10/03/16 (!) 305 lb (138.3 kg)    PHYSICAL EXAM: General:  Obese woman sitting in chair.  Oxygen saturations 87% in the clinic. She refused oxygen. Walked in the clinic with a walker.  HEENT: normal. Poor dentition Anicteric Neck: supple. JVP 5-6 . Carotids 2+ bilat; no bruits. No lymphadenopathy or thryomegaly appreciated. Cor: PMI nondisplaced. Regular rate & rhythm. 2/6 TR loud P2 Lungs: clear no wheezing Abdomen: markedly obese, soft, nontender, nondistended. No hepatosplenomegaly. No bruits or masses. Good bowel sounds. Extremities: no cyanosis, clubbing, rash, edema Neuro: alert & oriented x 3, cranial nerves grossly intact. moves all 4 extremities w/o difficulty. Affect pleasant  ECG: NSR 91 bpm +RAE diffuse ST-T wave abnormalities. Personally reviewed    ASSESSMENT & PLAN: 1. Pulmonary Hypertension ECHO PA pressures elevated 86 mm hg on 4/19 with RV strain  Discussed RHC to further evaluate but she refuses. She will consider PFTs and sleep study. We will set up today.   2. Chronic Hypoxic Respiratory Failure Needs 4 liters oxygen continuously. O2 sat 87% on arrival. Needs to wear oxygen continuously. We discussed.   3. Suspected Sleep Apnea. Elevated pulmonary pressures on ECHO.  Today we set up sleep study.    4. Chronic Diastolic Heart Failure ECHO Grade I DD.  Volume status stable. Continue torsemide 20 mg twice a day   5.  Obesity   Body mass index is 47.23 kg/m.   6. H/O Schwannoma completed  radiation in 2014.   Follow up in 3 months with Dr Vaughan Browner   Darrick Grinder NP-C  11:06 AM   Patient seen and examined with Darrick Grinder, NP. We discussed all aspects of the encounter. I agree with the assessment and plan as stated above.   57 y/o woman with morbid obesity with incidental finding of PAH on CT for shoulder pain. Echo (Personally reviewed) confirms PAH with severe RV strain. Suspect due to OSA/OHS/hypoxic lung disease. Have suggested cardiac cath but she refuses. Also stressed need for being more complaint with O2. After long discussion she has agreed to sleep study and PFTs. Will also need VQ scan at some point though I think CTEPH less likely. Volume status currently ok. Will need to lose weight. We will see back in next 1-2 months to review testing and plan next steps though she appears quite resistant to treating this aggressively.   Glori Bickers, MD  9:35 PM

## 2018-02-05 NOTE — Patient Instructions (Signed)
Your physician has recommended that you have a sleep study. This test records several body functions during sleep, including: brain activity, eye movement, oxygen and carbon dioxide blood levels, heart rate and rhythm, breathing rate and rhythm, the flow of air through your mouth and nose, snoring, body muscle movements, and chest and belly movement.   Your physician has recommended that you have a pulmonary function test. Pulmonary Function Tests are a group of tests that measure how well air moves in and out of your lungs.  Your physician recommends that you schedule a follow-up appointment in: [place]  Do the following things EVERYDAY: 1) Weigh yourself in the morning before breakfast. Write it down and keep it in a log. 2) Take your medicines as prescribed 3) Eat low salt foods-Limit salt (sodium) to 2000 mg per day.  4) Stay as active as you can everyday 5) Limit all fluids for the day to less than 2 liters

## 2018-02-12 ENCOUNTER — Encounter (HOSPITAL_COMMUNITY): Payer: Medicare HMO

## 2018-02-14 NOTE — Addendum Note (Signed)
Encounter addended by: Conrad Manhattan, NP on: 02/14/2018 8:44 AM  Actions taken: Sign clinical note

## 2018-02-21 ENCOUNTER — Ambulatory Visit (HOSPITAL_COMMUNITY)
Admission: RE | Admit: 2018-02-21 | Discharge: 2018-02-21 | Disposition: A | Discharge status: Home / Self Care | Payer: Medicare HMO | Source: Ambulatory Visit | Attending: Adult Health | Admitting: Adult Health

## 2018-02-21 DIAGNOSIS — J449 Chronic obstructive pulmonary disease, unspecified: Secondary | ICD-10-CM | POA: Insufficient documentation

## 2018-02-21 DIAGNOSIS — I272 Pulmonary hypertension, unspecified: Secondary | ICD-10-CM | POA: Insufficient documentation

## 2018-02-21 LAB — PULMONARY FUNCTION TEST
DL/VA % pred: 96 %
DL/VA: 4.73 ml/min/mmHg/L
DLCO unc % pred: 52 %
DLCO unc: 13.33 ml/min/mmHg
FEF 25-75 Post: 1.89 L/sec
FEF 25-75 Pre: 1.14 L/sec
FEF2575-%CHANGE-POST: 65 %
FEF2575-%PRED-POST: 71 %
FEF2575-%Pred-Pre: 43 %
FEV1-%CHANGE-POST: 12 %
FEV1-%PRED-POST: 36 %
FEV1-%Pred-Pre: 32 %
FEV1-POST: 1.01 L
FEV1-PRE: 0.9 L
FEV1FVC-%CHANGE-POST: 4 %
FEV1FVC-%PRED-PRE: 107 %
FEV6-%Change-Post: 7 %
FEV6-%PRED-POST: 32 %
FEV6-%Pred-Pre: 30 %
FEV6-PRE: 1.06 L
FEV6-Post: 1.14 L
FEV6FVC-%PRED-PRE: 103 %
FEV6FVC-%Pred-Post: 103 %
FVC-%CHANGE-POST: 7 %
FVC-%PRED-POST: 31 %
FVC-%PRED-PRE: 29 %
FVC-POST: 1.14 L
FVC-PRE: 1.06 L
POST FEV1/FVC RATIO: 89 %
PRE FEV6/FVC RATIO: 100 %
Post FEV6/FVC ratio: 100 %
Pre FEV1/FVC ratio: 85 %
RV % PRED: 97 %
RV: 1.89 L
TLC % pred: 61 %
TLC: 3.2 L

## 2018-02-21 MED ORDER — ALBUTEROL SULFATE (2.5 MG/3ML) 0.083% IN NEBU
2.5000 mg | INHALATION_SOLUTION | Freq: Once | RESPIRATORY_TRACT | Status: AC
  Administered 2018-02-21: 2.5 mg via RESPIRATORY_TRACT

## 2018-03-06 ENCOUNTER — Encounter (HOSPITAL_COMMUNITY): Payer: Self-pay | Admitting: Emergency Medicine

## 2018-03-06 ENCOUNTER — Emergency Department (HOSPITAL_COMMUNITY): Payer: Medicare HMO

## 2018-03-06 ENCOUNTER — Emergency Department (HOSPITAL_COMMUNITY)
Admission: EM | Admit: 2018-03-06 | Discharge: 2018-03-07 | Disposition: A | Discharge status: Home / Self Care | Payer: Medicare HMO | Attending: Emergency Medicine | Admitting: Emergency Medicine

## 2018-03-06 DIAGNOSIS — E039 Hypothyroidism, unspecified: Secondary | ICD-10-CM | POA: Insufficient documentation

## 2018-03-06 DIAGNOSIS — Z9884 Bariatric surgery status: Secondary | ICD-10-CM | POA: Insufficient documentation

## 2018-03-06 DIAGNOSIS — G8929 Other chronic pain: Secondary | ICD-10-CM | POA: Diagnosis not present

## 2018-03-06 DIAGNOSIS — R42 Dizziness and giddiness: Secondary | ICD-10-CM

## 2018-03-06 DIAGNOSIS — M25511 Pain in right shoulder: Secondary | ICD-10-CM | POA: Insufficient documentation

## 2018-03-06 DIAGNOSIS — Z85841 Personal history of malignant neoplasm of brain: Secondary | ICD-10-CM | POA: Insufficient documentation

## 2018-03-06 DIAGNOSIS — I10 Essential (primary) hypertension: Secondary | ICD-10-CM | POA: Diagnosis not present

## 2018-03-06 DIAGNOSIS — I2510 Atherosclerotic heart disease of native coronary artery without angina pectoris: Secondary | ICD-10-CM | POA: Insufficient documentation

## 2018-03-06 DIAGNOSIS — Z9049 Acquired absence of other specified parts of digestive tract: Secondary | ICD-10-CM | POA: Diagnosis not present

## 2018-03-06 LAB — BASIC METABOLIC PANEL
Anion gap: 8 (ref 5–15)
BUN: 15 mg/dL (ref 6–20)
CALCIUM: 9.5 mg/dL (ref 8.9–10.3)
CHLORIDE: 102 mmol/L (ref 101–111)
CO2: 30 mmol/L (ref 22–32)
CREATININE: 1.2 mg/dL — AB (ref 0.44–1.00)
GFR calc Af Amer: 58 mL/min — ABNORMAL LOW (ref >60)
GFR calc non Af Amer: 50 mL/min — ABNORMAL LOW (ref >60)
GLUCOSE: 93 mg/dL (ref 65–99)
Potassium: 6 mmol/L — ABNORMAL HIGH (ref 3.5–5.1)
Sodium: 140 mmol/L (ref 135–145)

## 2018-03-06 LAB — I-STAT TROPONIN, ED: TROPONIN I, POC: 0 ng/mL (ref 0.00–0.08)

## 2018-03-06 LAB — CBC
HCT: 48.3 % — ABNORMAL HIGH (ref 36.0–46.0)
HEMOGLOBIN: 14.7 g/dL (ref 12.0–15.0)
MCH: 27.2 pg (ref 26.0–34.0)
MCHC: 30.4 g/dL (ref 30.0–36.0)
MCV: 89.3 fL (ref 78.0–100.0)
Platelets: 233 10*3/uL (ref 150–400)
RBC: 5.41 MIL/uL — ABNORMAL HIGH (ref 3.87–5.11)
RDW: 17.1 % — ABNORMAL HIGH (ref 11.5–15.5)
WBC: 8.3 10*3/uL (ref 4.0–10.5)

## 2018-03-06 LAB — POTASSIUM: Potassium: 4.7 mmol/L (ref 3.5–5.1)

## 2018-03-06 NOTE — Discharge Instructions (Signed)

## 2018-03-06 NOTE — ED Notes (Signed)
Report called to [facility]

## 2018-03-06 NOTE — ED Notes (Signed)
[facility] called  

## 2018-03-06 NOTE — ED Notes (Signed)
ED Provider at bedside. 

## 2018-03-06 NOTE — ED Notes (Signed)
Pt to xray at this time.

## 2018-03-06 NOTE — ED Triage Notes (Signed)
Pt reports right shoulder pain, increased pain with movement and palpation. Denies injury. Pt was seen in April for same complaints, never followed up with orthopedist. Pt also reported sob and dizzy spills after sitting up from bed upon ems arrival. Pt is supposed to wear 2L [place] at facility but staff says pt is not compliant. Pt reports some sob, denies cp. Pt A&Ox4.   EMS VS BP 100/50, HR 70s, 24 RR, 90-94% on 4L [place].  12 lead unremarkable.

## 2018-03-06 NOTE — ED Provider Notes (Signed)
Emergency Department Provider Note   I have reviewed the triage vital signs and the nursing notes.   HISTORY  Chief Complaint Shoulder Pain   HPI Gabriella Roth is a 57 y.o. female with PMH of CAD, HTN, Hypothyroidism, elevated BMI, and developing chronic atraumatic right shoulder pain presents to the emergency department for evaluation of continued right shoulder pain with lightheadedness.  The patient states that she was seen in the emergency department in April with right shoulder pain and syncope.  She stayed in the hospital and was started on home oxygen and referred to orthopedics for her shoulder pain.  When she returned home, the pain had improved to the point where she canceled her orthopedic surgery appointment.  She states that she uses her oxygen only intermittently.  Over the past month her right shoulder began hurting her again and she is felt lightheaded mainly with ambulation to the bathroom.  She denies any heart palpitations or chest pain.  No dyspnea.  Pain in her shoulder is worse with movement and does not radiate to the chest.  She denies any fevers or chills.   Past Medical History:  Diagnosis Date  . Cancer ([place])    Brain  . Coronary artery disease   . History of radiation therapy 08/01/2013   EBRT: Completed 45 Gy out of a planned 54 Gy, completed 08/01/13 [facility]  . Hypertension   . Hypothyroidism   . Obesity     Patient Active Problem List   Diagnosis Date Noted  . Acute respiratory failure with hypoxia ([place]) 01/18/2018  . HTN (hypertension) 01/18/2018  . Right shoulder pain 01/18/2018    Past Surgical History:  Procedure Laterality Date  . Arm Surgery    . CHOLECYSTECTOMY    . GASTRIC BYPASS    . TONSILLECTOMY      Current Outpatient Rx  . Order #: [id number] Class: Historical Med  . Order #: [id number] Class: Historical Med  . Order #: [id number] Class: Historical Med  . Order #: [id number] Class: Print  . Order #: [id number] Class:  Historical Med  . Order #: [id number] Class: Historical Med  . Order #: [id number] Class: Historical Med  . Order #: [id number] Class: Historical Med  . Order #: [id number] Class: Historical Med  . Order #: [id number] Class: Historical Med  . Order #: [id number] Class: Historical Med  . Order #: [id number] Class: Print  . Order #: [id number] Class: Historical Med  . Order #: [id number] Class: Historical Med    Allergies Avapro [irbesartan]; Benadryl [diphenhydramine]; Codeine; Ibuprofen; Nexium [esomeprazole magnesium]; Prevacid [lansoprazole]; Prilosec [omeprazole]; Vasotec [enalapril]; Vistaril [hydroxyzine hcl]; Zyrtec [cetirizine]; Bactrim [sulfamethoxazole-trimethoprim]; Clindamycin/lincomycin; and Keflex [cephalexin]  No family history on file.  Social History Social History   Tobacco Use  . Smoking status: Never Smoker  . Smokeless tobacco: Never Used  Substance Use Topics  . Alcohol use: No  . Drug use: No    Review of Systems  Constitutional: No fever/chills. Positive lightheadedness.  Eyes: No visual changes. ENT: No sore throat. Cardiovascular: Denies chest pain. Respiratory: Denies shortness of breath. Gastrointestinal: No abdominal pain.  No nausea, no vomiting.  No diarrhea.  No constipation. Genitourinary: Negative for dysuria. Musculoskeletal: Negative for back pain. Positive right shoulder pain.  Skin: Negative for rash. Neurological: Negative for headaches, focal weakness or numbness.  10-point ROS otherwise negative.  ____________________________________________   PHYSICAL EXAM:  VITAL SIGNS: ED Triage Vitals  Enc Vitals Group     BP 03/06/18 1626 (!) 127/92     Pulse Rate 03/06/18 1626 74  Resp 03/06/18 1626 20     Temp 03/06/18 1626 98.4 F (36.9 C)     Temp Source 03/06/18 1626 Oral     SpO2 03/06/18 1626 95 %     Weight 03/06/18 1620 290 lb (131.5 kg)     Height 03/06/18 1620 5\' 5"  (1.651 m)     Pain Score 03/06/18 1620 9   Constitutional: Alert  and oriented. Well appearing and in no acute distress. Eyes: Conjunctivae are normal.  Head: Atraumatic. Nose: No congestion/rhinnorhea. Mouth/Throat: Mucous membranes are moist.  Neck: No stridor.   Cardiovascular: Normal rate, regular rhythm. Good peripheral circulation. Grossly normal heart sounds.   Respiratory: Normal respiratory effort.  No retractions. Lungs CTAB. Gastrointestinal: Soft and nontender. No distention.  Musculoskeletal: No lower extremity tenderness nor edema. No gross deformities of extremities. Mild limited ROM of the right shoulder. No swelling or erythema noted. Normal ROM of the right elbow and wrist.  Neurologic:  Normal speech and language. No gross focal neurologic deficits are appreciated.  Skin:  Skin is warm, dry and intact. No rash noted.  ____________________________________________   LABS (all labs ordered are listed, but only abnormal results are displayed)  Labs Reviewed  BASIC METABOLIC PANEL - Abnormal; Notable for the following components:      Result Value   Potassium 6.0 (*)    Creatinine, Ser 1.20 (*)    GFR calc non Af Amer 50 (*)    GFR calc Af Amer 58 (*)    All other components within normal limits  CBC - Abnormal; Notable for the following components:   RBC 5.41 (*)    HCT 48.3 (*)    RDW 17.1 (*)    All other components within normal limits  POTASSIUM  I-STAT TROPONIN, ED   ____________________________________________  EKG   EKG Interpretation  Date/Time:  Tuesday March 06 2018 16:38:35 EDT Ventricular Rate:  68 PR Interval:    QRS Duration: 94 QT Interval:  436 QTC Calculation: 464 R Axis:   80 Text Interpretation:  Sinus rhythm Nonspecific T abnormalities, diffuse leads No STEMI. Similar to prior.  Confirmed by Nanda Quinton [phone]) on 03/06/2018 4:44:53 PM       ____________________________________________  RADIOLOGY  Dg Chest 2 View  Result Date: 03/06/2018 CLINICAL DATA:  Shortness of breath and dizziness.  EXAM: CHEST - 2 VIEW COMPARISON:  CTA chest and chest x-ray dated January 17, 2018. FINDINGS: Stable mild cardiomegaly. Normal pulmonary vascularity. Unchanged elevation of the right hemidiaphragm with right lower lobe subsegmental atelectasis. No focal consolidation, pleural effusion, or pneumothorax. No acute osseous abnormality. IMPRESSION: No active cardiopulmonary disease. Electronically Signed   By: Titus Dubin M.D.   On: 03/06/2018 17:27   Dg Shoulder Right  Result Date: 03/06/2018 CLINICAL DATA:  RIGHT shoulder pain, increased pain with movement and palpation. Denies injury. EXAM: RIGHT SHOULDER - 2+ VIEW COMPARISON:  Plain film of the RIGHT shoulder dated 01/17/2018. FINDINGS: There is no evidence of fracture or dislocation. There is no evidence of arthropathy or other focal bone abnormality. Soft tissues are unremarkable. IMPRESSION: Negative. Electronically Signed   By: Franki Cabot M.D.   On: 03/06/2018 17:25    ____________________________________________   PROCEDURES  Procedure(s) performed:   Procedures  None ____________________________________________   INITIAL IMPRESSION / ASSESSMENT AND PLAN / ED COURSE  Pertinent labs & imaging results that were available during my care of the patient were reviewed by me and considered in my medical decision making (see chart for details).  Patient presents to the emergency department for evaluation of right shoulder pain with some associated lightheadedness when standing.  These symptoms are mostly chronic and she had an admission in April regarding both issues.  She is only intermittently wearing her oxygen and has an appointment scheduled with orthopedics later this month.  No concern for septic arthritis.  Extremely low suspicion for atypical PE presentation or acute coronary syndrome.  Given her lightheadedness plan to obtain single troponin with constant pain for weeks.  Patient is already taking tramadol and hydrocodone with no  significant relief in symptoms.  Advised topical medications but do not feel additional narcotics are indicated at this time.  Labs and plain films reviewed. Initial potassium was hemolyzed and elevated but normal on repeat. Plan to continue home pain meds and f/u with PCP and Ortho.   At this time, I do not feel there is any life-threatening condition present. I have reviewed and discussed all results (EKG, imaging, lab, urine as appropriate), exam findings with patient. I have reviewed nursing notes and appropriate previous records.  I feel the patient is safe to be discharged home without further emergent workup. Discussed usual and customary return precautions. Patient and family (if present) verbalize understanding and are comfortable with this plan.  Patient will follow-up with their primary care provider. If they do not have a primary care provider, information for follow-up has been provided to them. All questions have been answered.  ____________________________________________  FINAL CLINICAL IMPRESSION(S) / ED DIAGNOSES  Final diagnoses:  Chronic right shoulder pain  Lightheaded    Note:  This document was prepared using Dragon voice recognition software and may include unintentional dictation errors.  Nanda Quinton, MD Emergency Medicine    Peter Keyworth, [facility], MD 03/06/18 [phone]

## 2018-03-07 NOTE — ED Notes (Signed)
Kim RN gave report to facility, paperwork given to [facility]  Per [employer]

## 2018-03-12 ENCOUNTER — Encounter (HOSPITAL_BASED_OUTPATIENT_CLINIC_OR_DEPARTMENT_OTHER): Payer: Medicare HMO

## 2018-04-24 ENCOUNTER — Ambulatory Visit (HOSPITAL_BASED_OUTPATIENT_CLINIC_OR_DEPARTMENT_OTHER): Payer: 59 | Attending: Adult Health

## 2018-05-03 DEATH — deceased

## 2018-05-09 ENCOUNTER — Inpatient Hospital Stay (HOSPITAL_COMMUNITY): Admission: RE | Admit: 2018-05-09 | Payer: Medicare HMO | Source: Ambulatory Visit | Admitting: Internal Medicine

## 2018-10-13 IMAGING — CR DG SHOULDER 2+V*R*
2 series · 2 of 2 positions shown · non-contrast
Comparison: None.

CLINICAL DATA: Fall in parking lot. Right shoulder pain. Initial
encounter.

EXAM:
RIGHT SHOULDER - 2+ VIEW

[shoulder grashey]
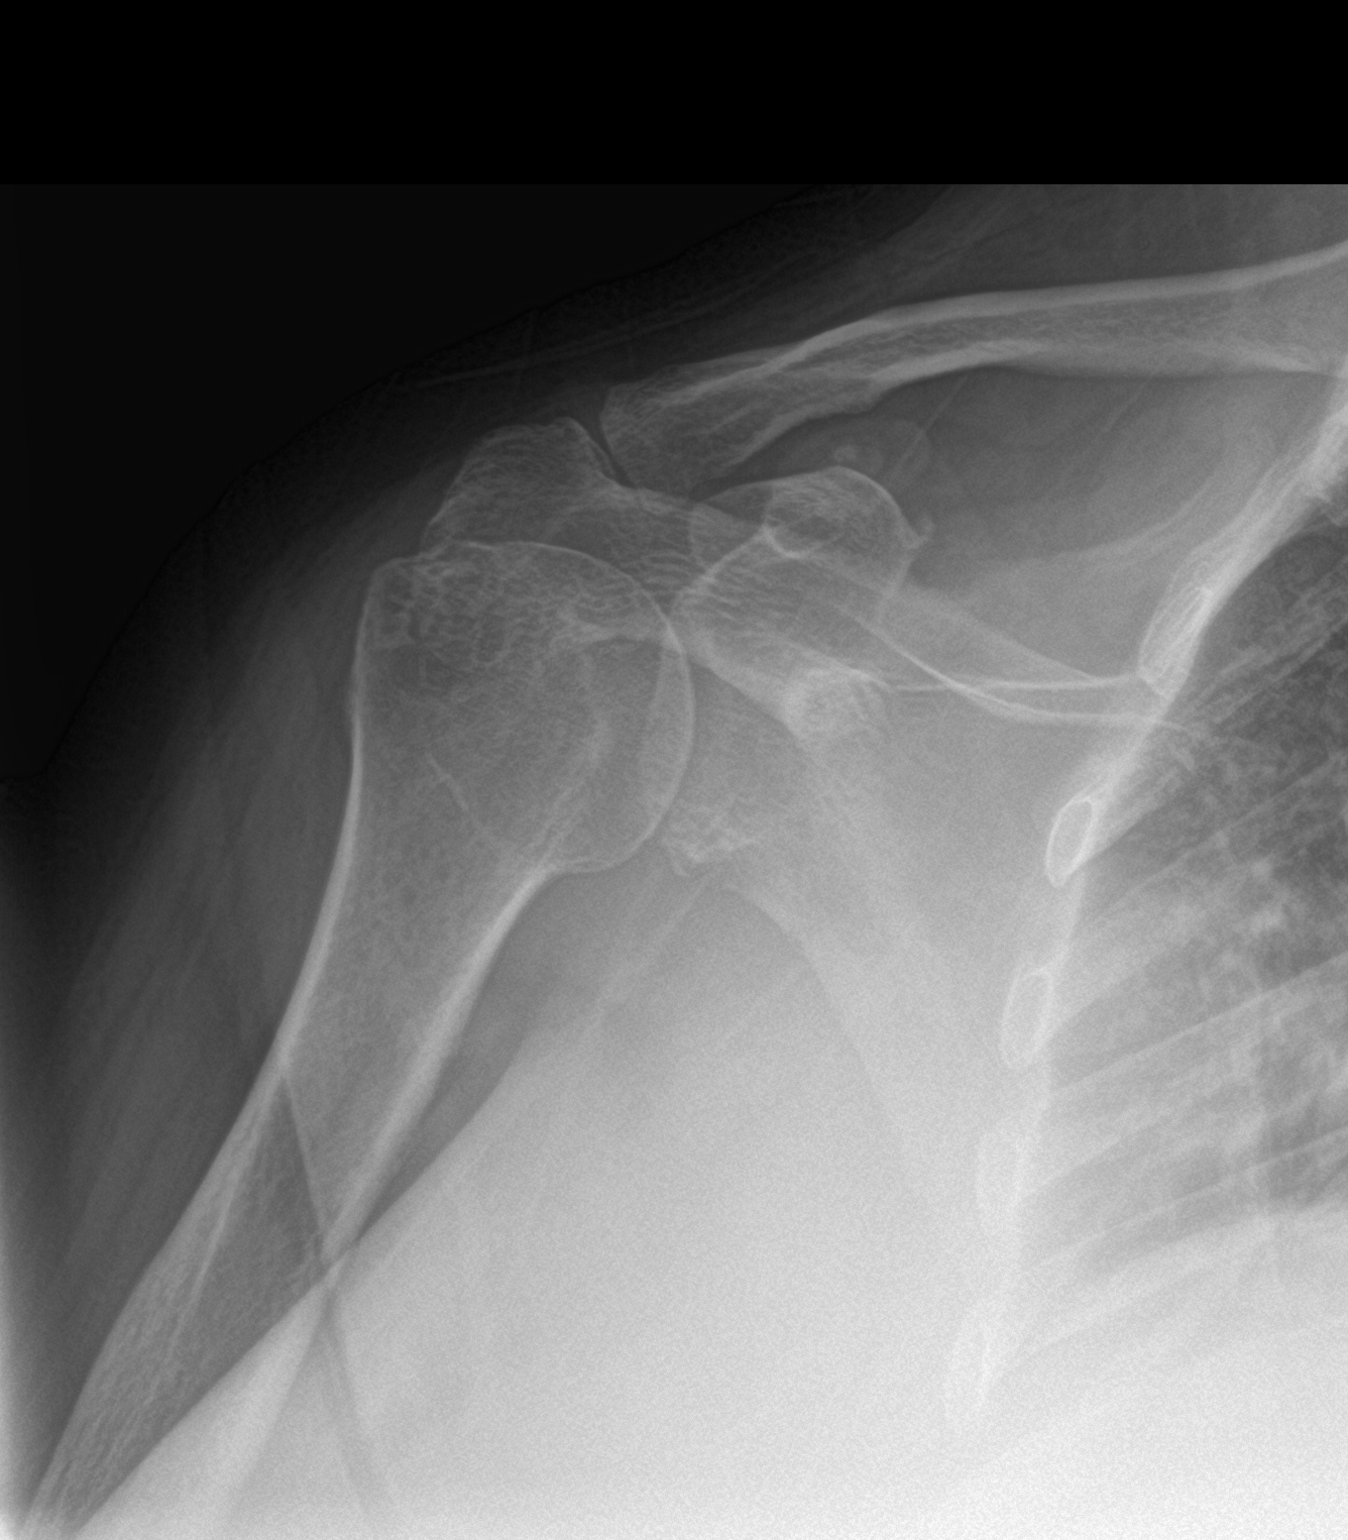

[shoulder y view]
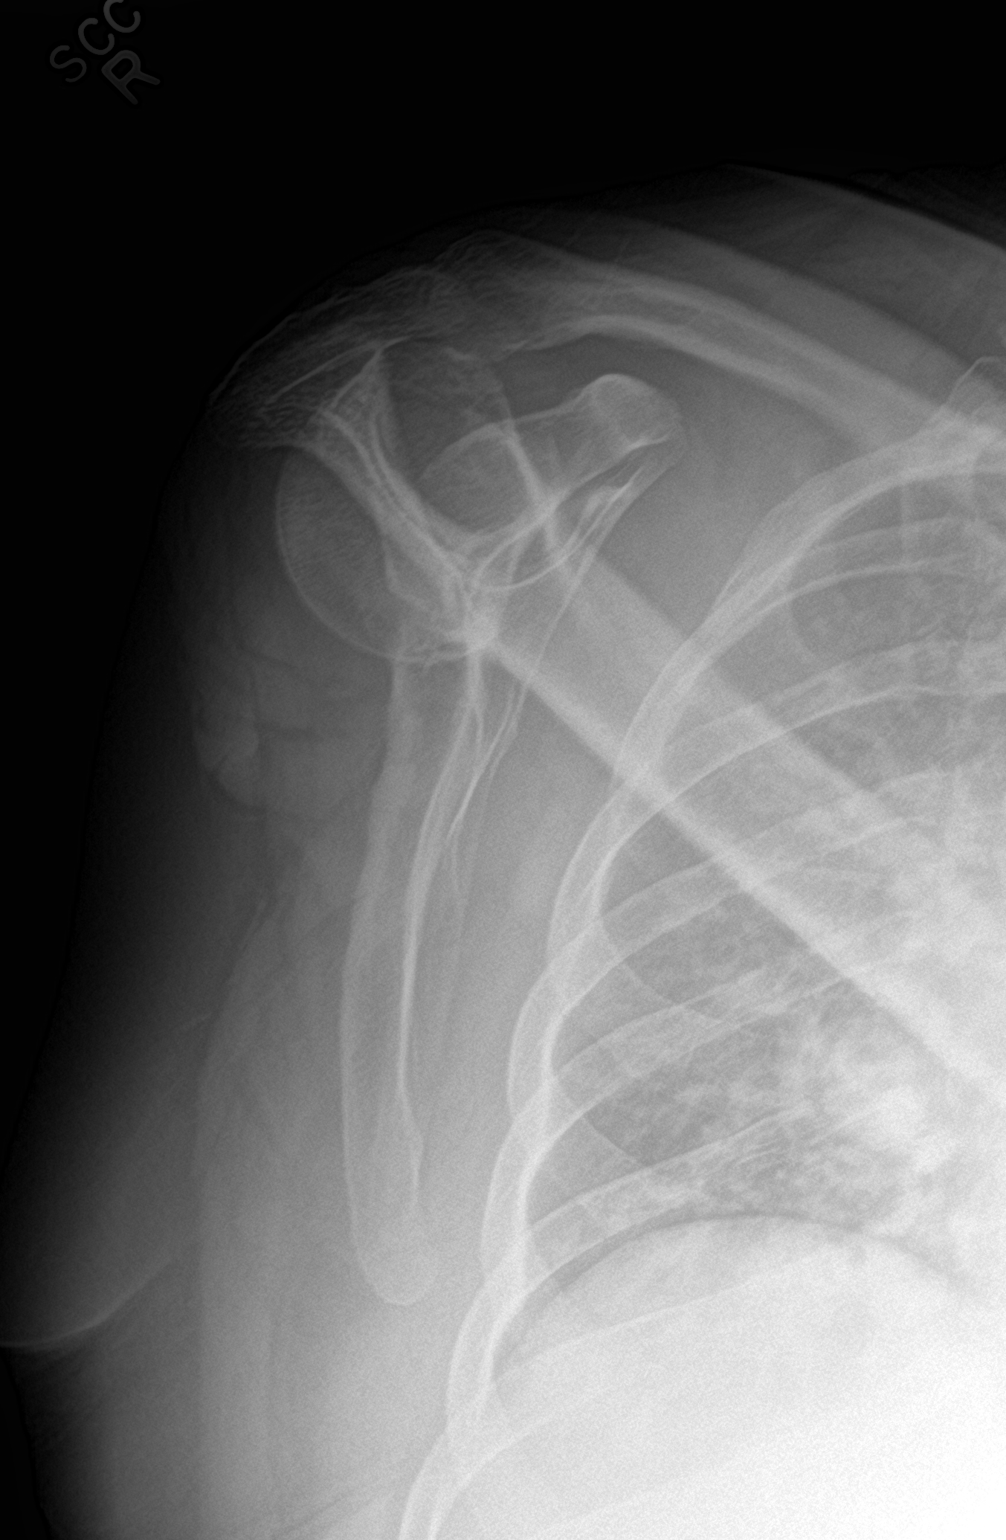

[2 of 2 positions shown; findings below may reference images not displayed]

FINDINGS: There is no evidence of fracture or dislocation. There is no
evidence of arthropathy or other focal bone abnormality. Soft
tissues are unremarkable.
IMPRESSION: Negative.

## 2018-11-30 IMAGING — CR DG CHEST 2V
2 series · 2 of 2 positions shown · non-contrast
Comparison: CTA chest and chest x-ray dated January 17, 2018.

CLINICAL DATA: Shortness of breath and dizziness.

EXAM:
CHEST - 2 VIEW

[chest lat]
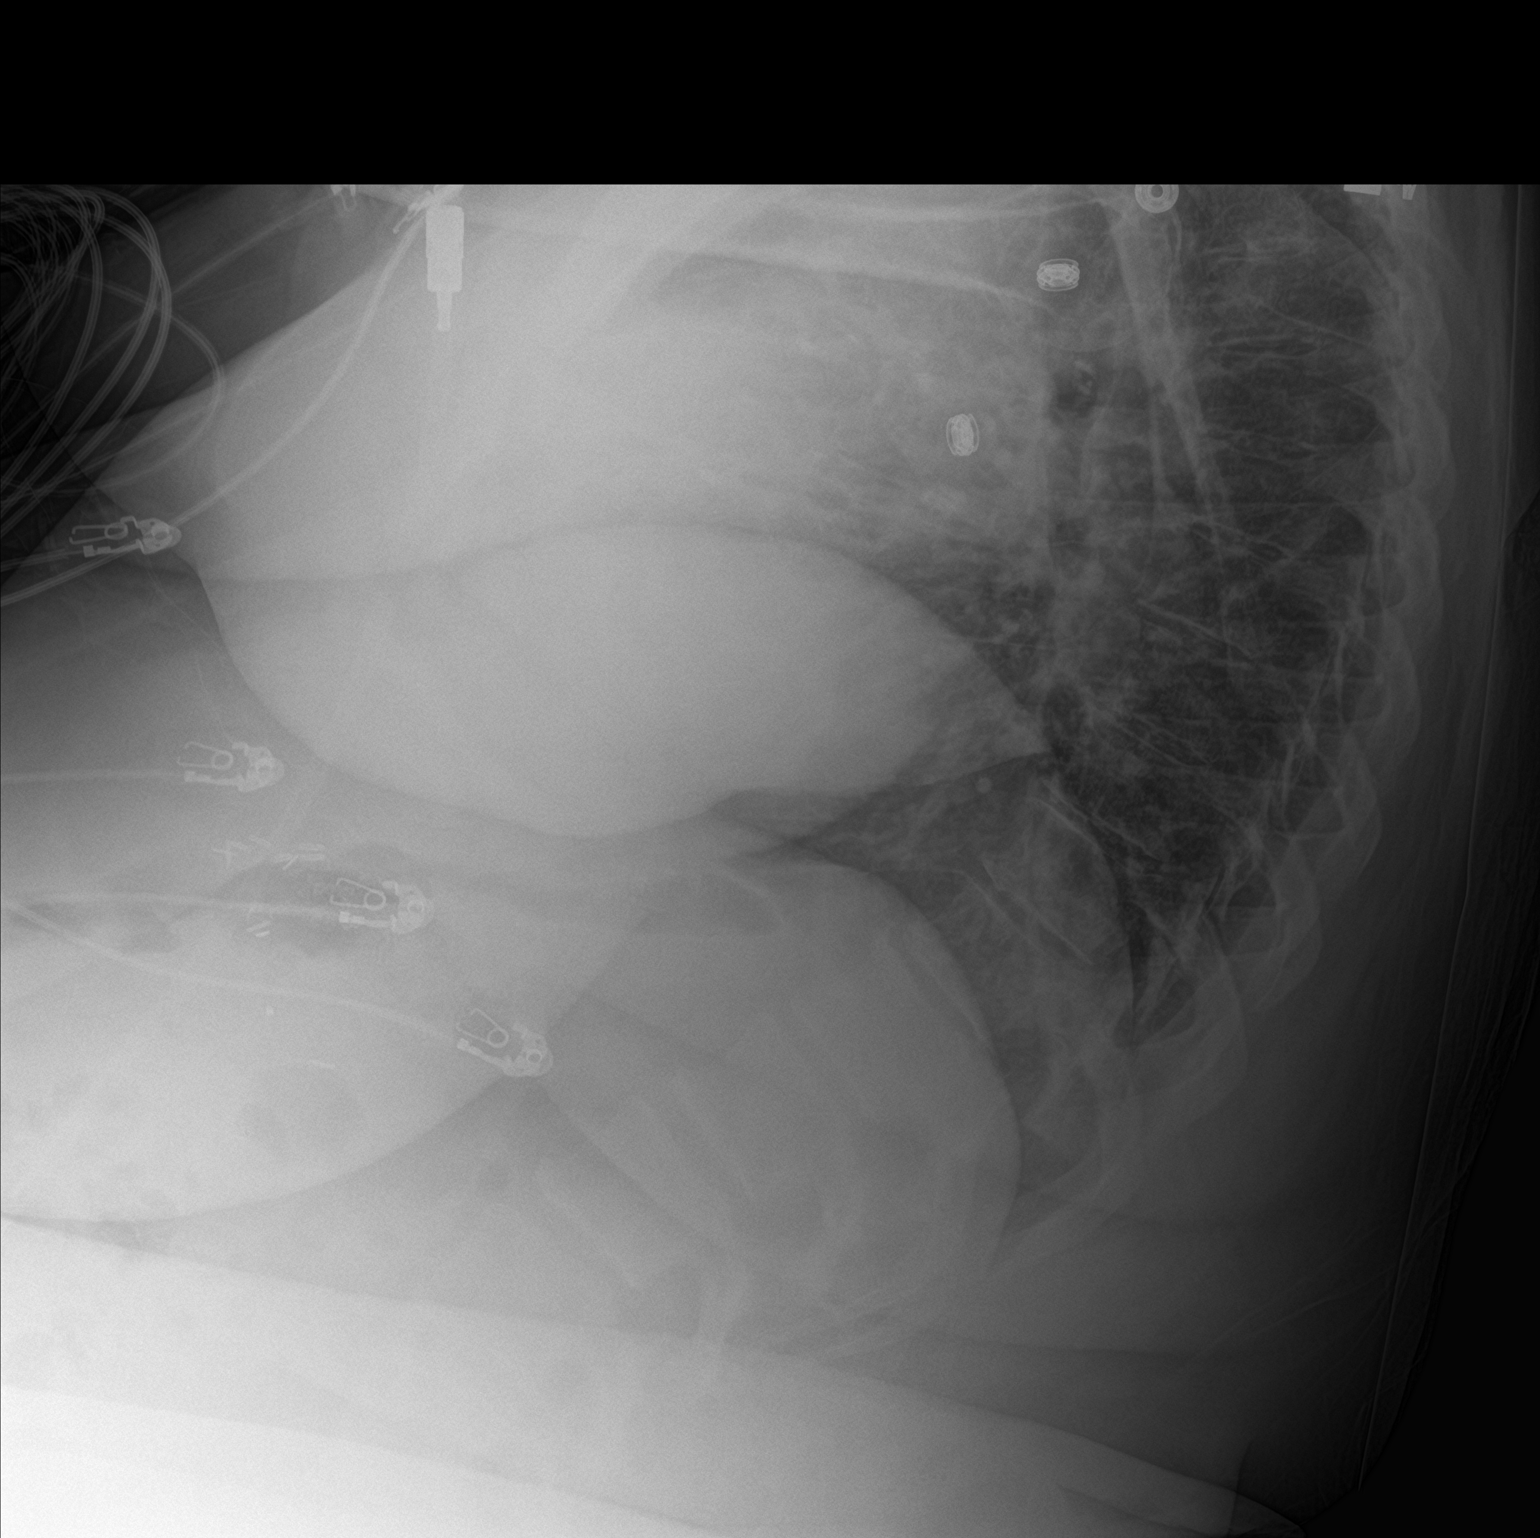

[chest ap]
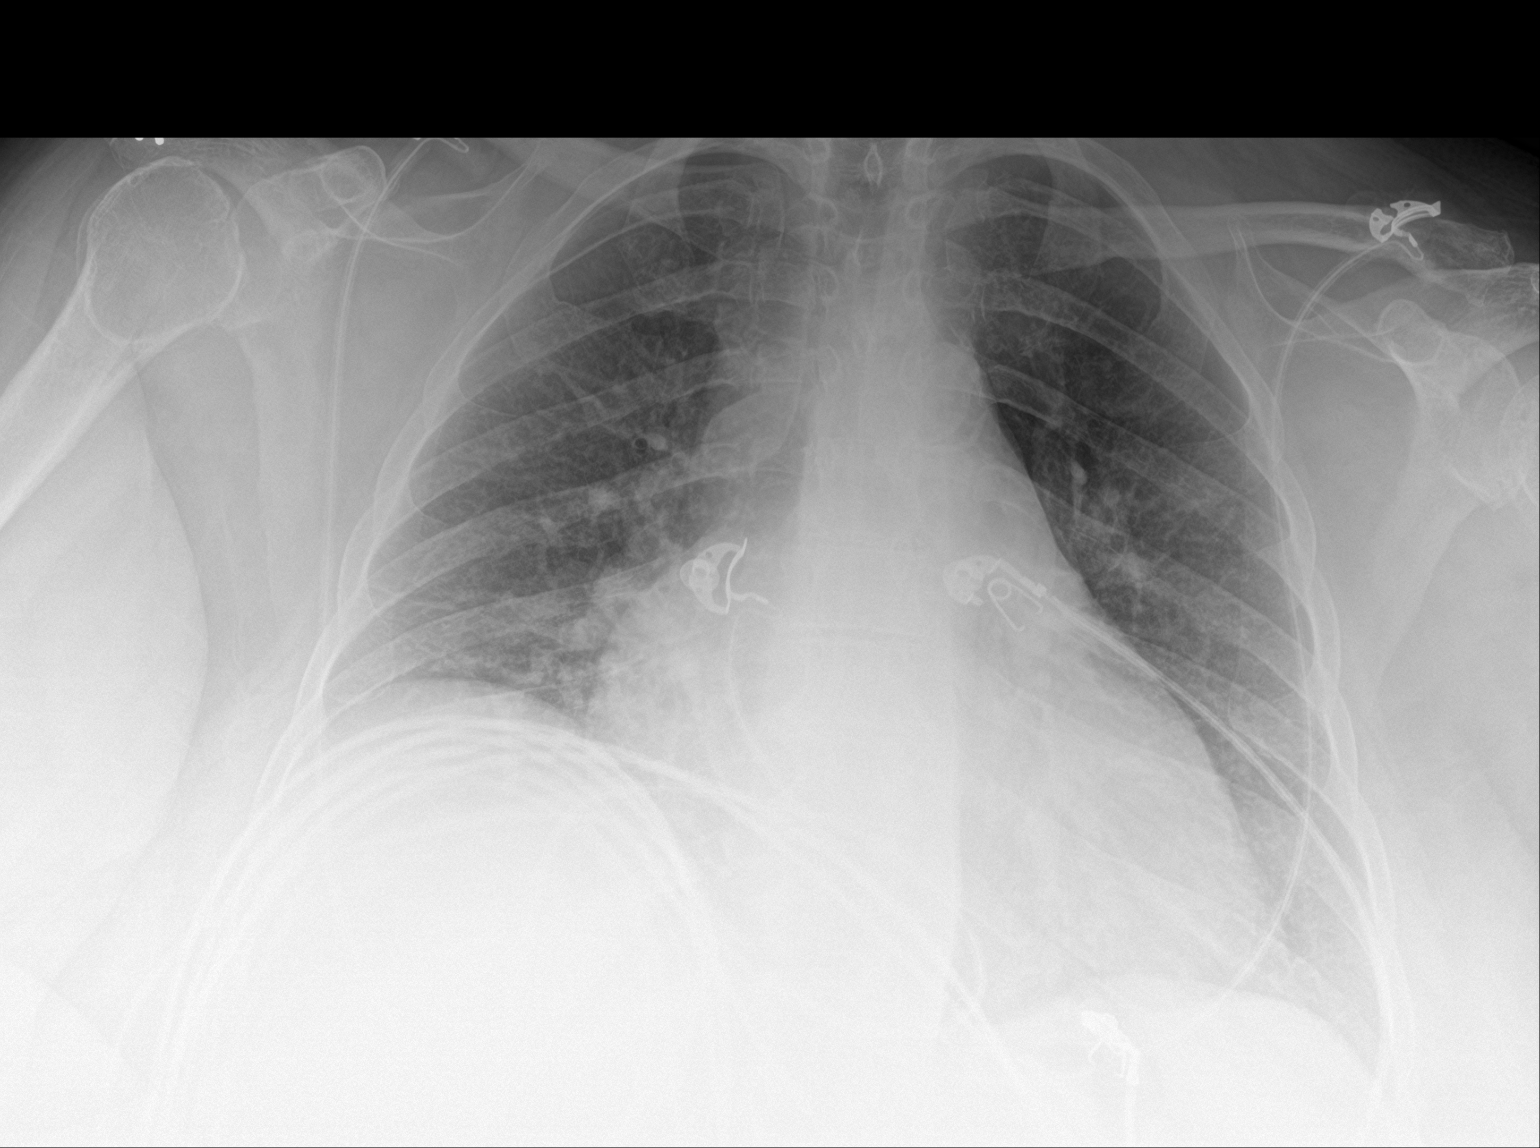

[2 of 2 positions shown; findings below may reference images not displayed]

FINDINGS: Stable mild cardiomegaly. Normal pulmonary vascularity. Unchanged
elevation of the right hemidiaphragm with right lower lobe
subsegmental atelectasis. No focal consolidation, pleural effusion,
or pneumothorax. No acute osseous abnormality.
IMPRESSION: No active cardiopulmonary disease.
